# Patient Record
Sex: Female | Born: 1985 | Race: Black or African American | Hispanic: No | Marital: Single | Smoking: Current every day smoker
Health system: Southern US, Community
[De-identification: ages and names within clinical notes are randomized; demographics above are authoritative.]

## PROBLEM LIST (undated history)

## (undated) ENCOUNTER — Inpatient Hospital Stay (HOSPITAL_COMMUNITY): Payer: Self-pay

## (undated) DIAGNOSIS — K819 Cholecystitis, unspecified: Secondary | ICD-10-CM

## (undated) DIAGNOSIS — A599 Trichomoniasis, unspecified: Secondary | ICD-10-CM

## (undated) DIAGNOSIS — A749 Chlamydial infection, unspecified: Secondary | ICD-10-CM

## (undated) HISTORY — PX: NO PAST SURGERIES: SHX2092

---

## 2006-01-01 ENCOUNTER — Inpatient Hospital Stay (HOSPITAL_COMMUNITY): Admission: AD | Admit: 2006-01-01 | Discharge: 2006-01-01 | Payer: Self-pay | Admitting: Family Medicine

## 2007-04-22 ENCOUNTER — Inpatient Hospital Stay (HOSPITAL_COMMUNITY): Admission: AD | Admit: 2007-04-22 | Discharge: 2007-04-22 | Payer: Self-pay | Admitting: Family Medicine

## 2007-04-24 ENCOUNTER — Inpatient Hospital Stay (HOSPITAL_COMMUNITY): Admission: AD | Admit: 2007-04-24 | Discharge: 2007-04-24 | Payer: Self-pay | Admitting: Family Medicine

## 2007-04-26 ENCOUNTER — Inpatient Hospital Stay (HOSPITAL_COMMUNITY): Admission: AD | Admit: 2007-04-26 | Discharge: 2007-04-26 | Payer: Self-pay | Admitting: Obstetrics & Gynecology

## 2007-05-03 ENCOUNTER — Ambulatory Visit (HOSPITAL_COMMUNITY): Admission: RE | Admit: 2007-05-03 | Discharge: 2007-05-03 | Payer: Self-pay | Admitting: Obstetrics & Gynecology

## 2007-06-13 ENCOUNTER — Inpatient Hospital Stay (HOSPITAL_COMMUNITY): Admission: AD | Admit: 2007-06-13 | Discharge: 2007-06-13 | Payer: Self-pay | Admitting: Obstetrics & Gynecology

## 2007-10-17 ENCOUNTER — Ambulatory Visit (HOSPITAL_COMMUNITY): Admission: RE | Admit: 2007-10-17 | Discharge: 2007-10-17 | Payer: Self-pay | Admitting: Obstetrics & Gynecology

## 2007-12-17 ENCOUNTER — Inpatient Hospital Stay (HOSPITAL_COMMUNITY): Admission: AD | Admit: 2007-12-17 | Discharge: 2007-12-17 | Payer: Self-pay | Admitting: Obstetrics & Gynecology

## 2007-12-20 ENCOUNTER — Inpatient Hospital Stay (HOSPITAL_COMMUNITY): Admission: AD | Admit: 2007-12-20 | Discharge: 2007-12-22 | Payer: Self-pay | Admitting: Obstetrics

## 2009-03-04 IMAGING — US US OB TRANSVAGINAL
1 series · 14 of 28 positions shown · non-contrast
Comparison: none

OBSTETRICAL ULTRASOUND:

 This ultrasound exam was performed in the [HOSPITAL] Ultrasound Department.  The OB US report was generated in the AS system, and faxed to the ordering physician.  This report is also available in [REDACTED] PACS.

[Series 1: us ob transvaginal · 32 acquisitions, 14 frames shown]
[im 2/32]
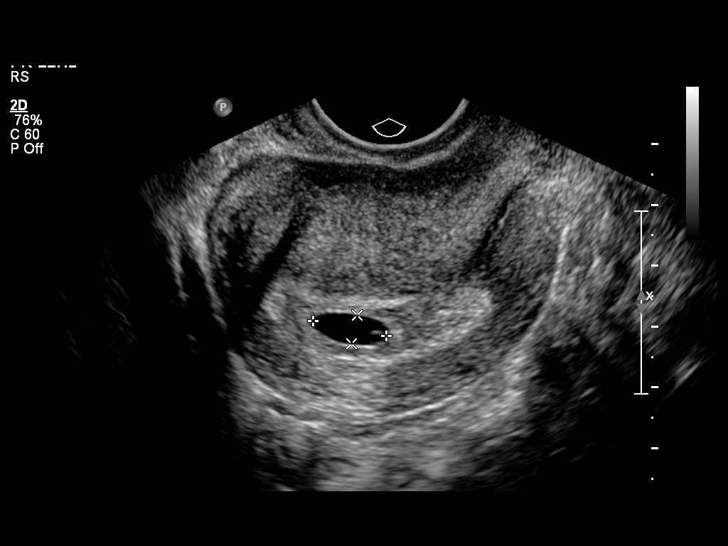
[im 4/32]
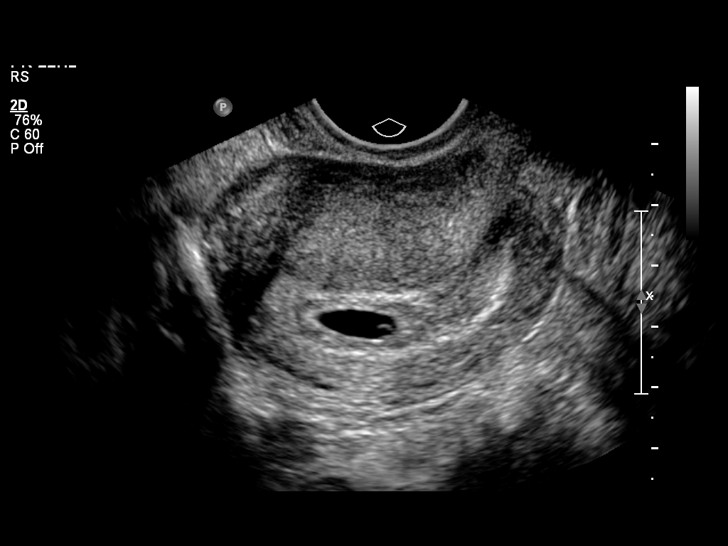
[im 6/32]
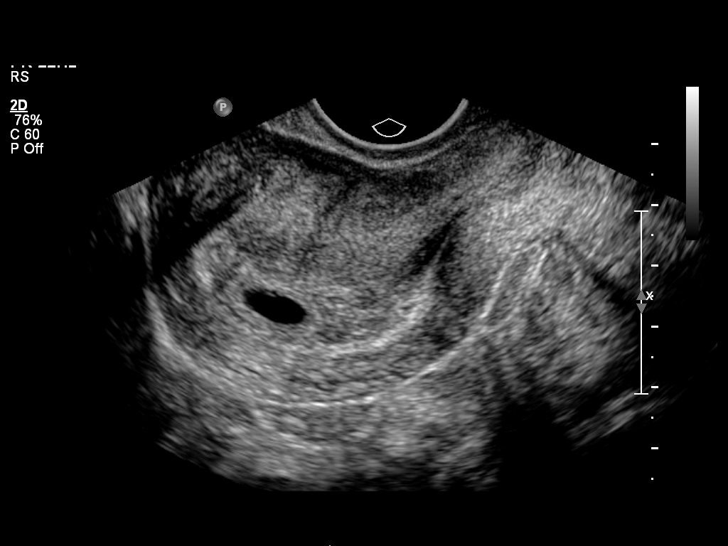
[im 9/32]
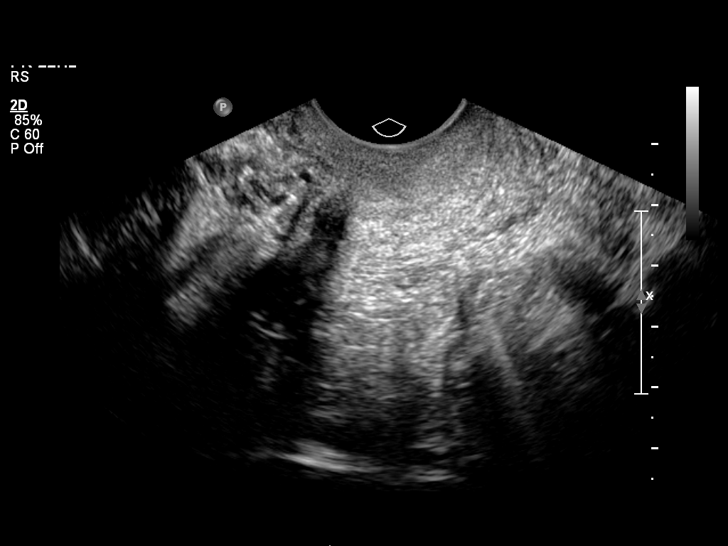
[im 11/32]
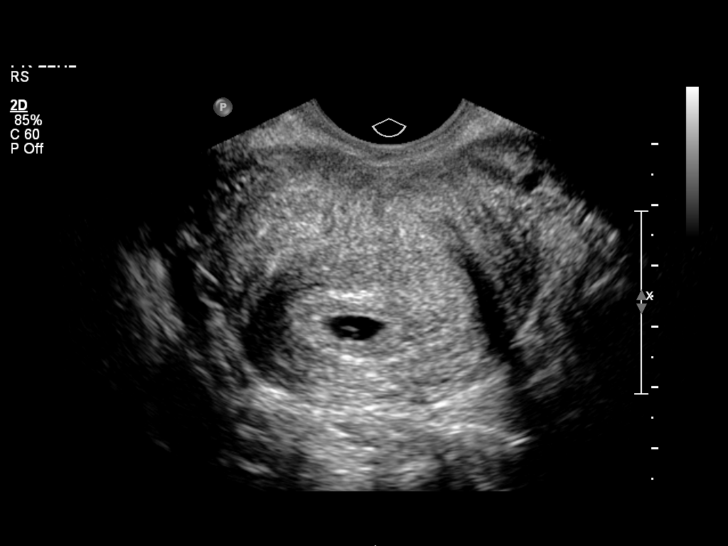
[im 13/32]
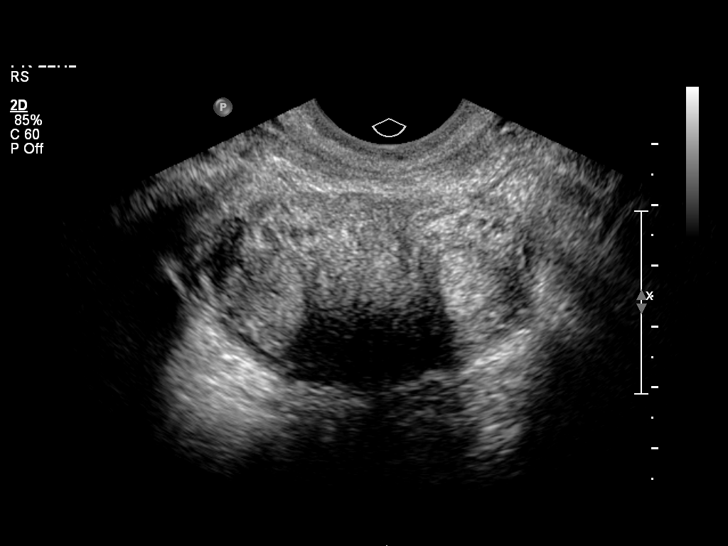
[im 15/32]
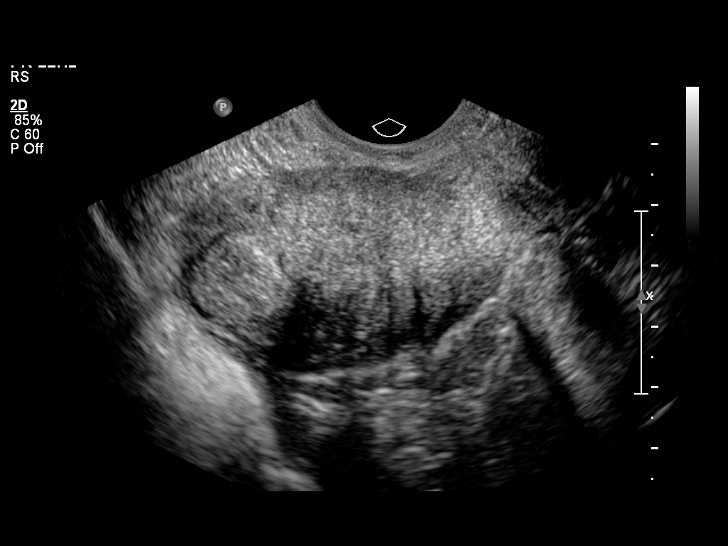
[im 18/32]
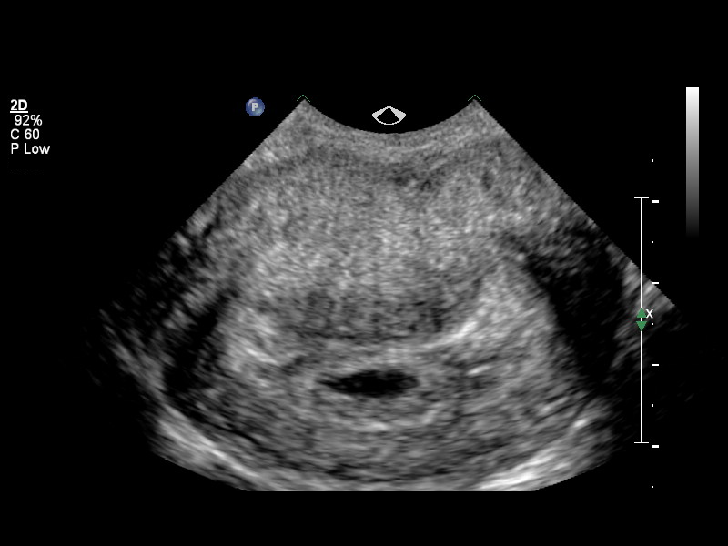
[im 20/32]
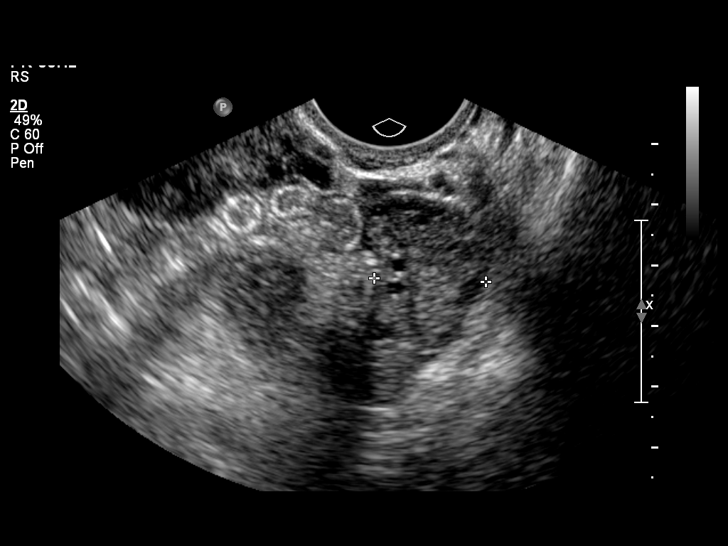
[im 22/32]
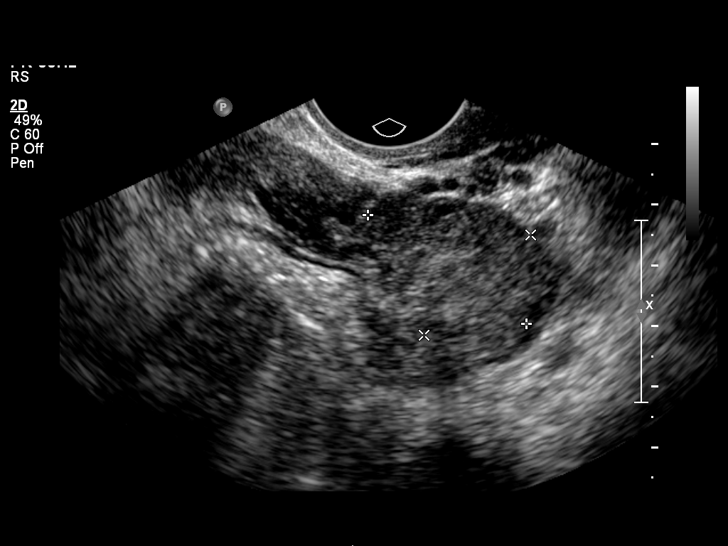
[im 25/32]
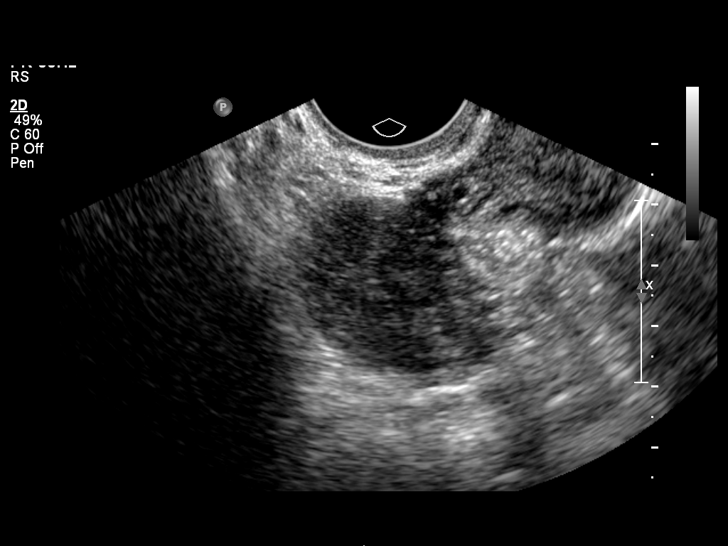
[im 27/32]
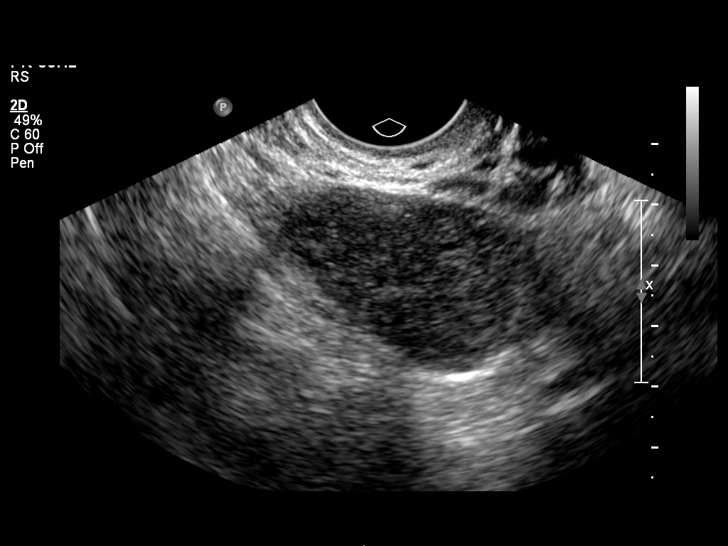
[im 29/32]
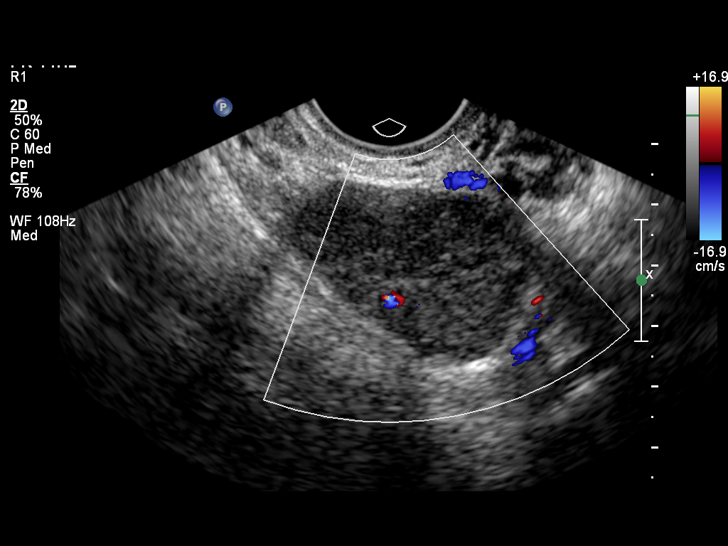
[im 32/32]
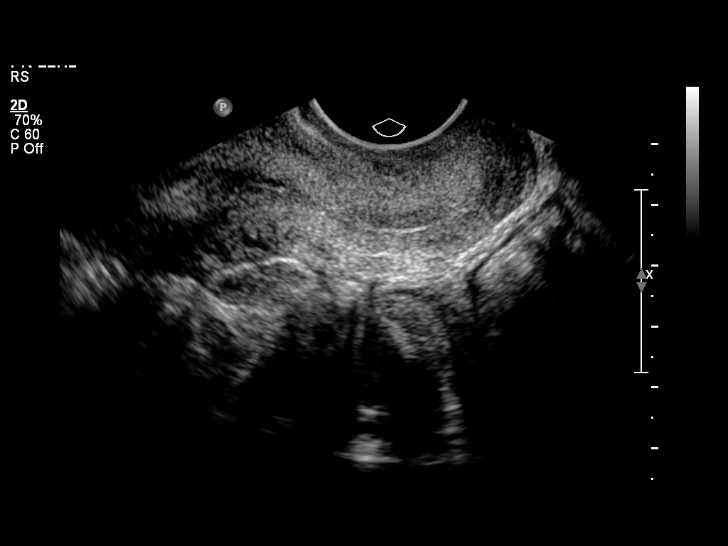

[14 of 28 positions shown; findings below may reference images not displayed]

IMPRESSION: See AS Obstetric US report.

## 2009-11-08 ENCOUNTER — Inpatient Hospital Stay (HOSPITAL_COMMUNITY): Admission: AD | Admit: 2009-11-08 | Discharge: 2009-11-08 | Payer: Self-pay | Admitting: Obstetrics & Gynecology

## 2010-03-29 ENCOUNTER — Inpatient Hospital Stay (HOSPITAL_COMMUNITY): Admission: AD | Admit: 2010-03-29 | Discharge: 2010-03-29 | Payer: Self-pay | Admitting: Obstetrics & Gynecology

## 2010-11-25 ENCOUNTER — Inpatient Hospital Stay (HOSPITAL_COMMUNITY)
Admission: AD | Admit: 2010-11-25 | Discharge: 2010-11-25 | Payer: Self-pay | Source: Home / Self Care | Attending: Obstetrics & Gynecology | Admitting: Obstetrics & Gynecology

## 2011-02-24 LAB — URINALYSIS, ROUTINE W REFLEX MICROSCOPIC
Glucose, UA: NEGATIVE mg/dL
Nitrite: POSITIVE — AB
Protein, ur: NEGATIVE mg/dL
Specific Gravity, Urine: 1.025 (ref 1.005–1.030)
pH: 6 (ref 5.0–8.0)

## 2011-02-24 LAB — WET PREP, GENITAL
Clue Cells Wet Prep HPF POC: NONE SEEN
Yeast Wet Prep HPF POC: NONE SEEN

## 2011-02-24 LAB — URINE MICROSCOPIC-ADD ON

## 2011-02-24 LAB — POCT PREGNANCY, URINE: Preg Test, Ur: NEGATIVE

## 2011-03-03 LAB — URINE MICROSCOPIC-ADD ON

## 2011-03-03 LAB — WET PREP, GENITAL: Trich, Wet Prep: NONE SEEN

## 2011-03-03 LAB — URINALYSIS, ROUTINE W REFLEX MICROSCOPIC
Nitrite: POSITIVE — AB
Specific Gravity, Urine: 1.025 (ref 1.005–1.030)

## 2011-03-03 LAB — CBC
MCV: 83.3 fL (ref 78.0–100.0)
Platelets: 230 10*3/uL (ref 150–400)
RBC: 4.38 MIL/uL (ref 3.87–5.11)
RDW: 14.3 % (ref 11.5–15.5)
WBC: 7.8 10*3/uL (ref 4.0–10.5)

## 2011-03-18 LAB — URINALYSIS, ROUTINE W REFLEX MICROSCOPIC
Glucose, UA: NEGATIVE mg/dL
Leukocytes, UA: NEGATIVE
Protein, ur: NEGATIVE mg/dL
Specific Gravity, Urine: 1.025 (ref 1.005–1.030)
Urobilinogen, UA: 0.2 mg/dL (ref 0.0–1.0)
pH: 6 (ref 5.0–8.0)

## 2011-03-18 LAB — GC/CHLAMYDIA PROBE AMP, GENITAL
Chlamydia, DNA Probe: NEGATIVE
GC Probe Amp, Genital: NEGATIVE

## 2011-03-18 LAB — WET PREP, GENITAL
Trich, Wet Prep: NONE SEEN
Yeast Wet Prep HPF POC: NONE SEEN

## 2011-03-18 LAB — URINE MICROSCOPIC-ADD ON

## 2011-08-27 ENCOUNTER — Encounter (HOSPITAL_COMMUNITY): Payer: Self-pay | Admitting: *Deleted

## 2011-08-27 ENCOUNTER — Inpatient Hospital Stay (HOSPITAL_COMMUNITY)
Admission: AD | Admit: 2011-08-27 | Discharge: 2011-08-27 | Disposition: A | Discharge status: Home / Self Care | Payer: Self-pay | Source: Ambulatory Visit | Attending: Family Medicine | Admitting: Family Medicine

## 2011-08-27 DIAGNOSIS — R109 Unspecified abdominal pain: Secondary | ICD-10-CM | POA: Insufficient documentation

## 2011-08-27 DIAGNOSIS — A499 Bacterial infection, unspecified: Secondary | ICD-10-CM

## 2011-08-27 DIAGNOSIS — B9689 Other specified bacterial agents as the cause of diseases classified elsewhere: Secondary | ICD-10-CM | POA: Insufficient documentation

## 2011-08-27 DIAGNOSIS — N760 Acute vaginitis: Secondary | ICD-10-CM | POA: Insufficient documentation

## 2011-08-27 DIAGNOSIS — N390 Urinary tract infection, site not specified: Secondary | ICD-10-CM | POA: Insufficient documentation

## 2011-08-27 LAB — URINALYSIS, ROUTINE W REFLEX MICROSCOPIC
Hgb urine dipstick: NEGATIVE
Nitrite: POSITIVE — AB
Protein, ur: NEGATIVE mg/dL
Urobilinogen, UA: 1 mg/dL (ref 0.0–1.0)

## 2011-08-27 LAB — WET PREP, GENITAL: Yeast Wet Prep HPF POC: NONE SEEN

## 2011-08-27 LAB — URINE MICROSCOPIC-ADD ON

## 2011-08-27 MED ORDER — CIPROFLOXACIN HCL 500 MG PO TABS: 500.0000 mg | ORAL_TABLET | Freq: Two times a day (BID) | ORAL | Status: AC

## 2011-08-27 MED ORDER — AZITHROMYCIN 250 MG PO TABS
1000.0000 mg | ORAL_TABLET | Freq: Once | ORAL | Status: AC
  Administered 2011-08-27: 1000 mg via ORAL
  Filled 2011-08-27: qty 4

## 2011-08-27 MED ORDER — AZITHROMYCIN 1 G PO PACK: 1.0000 g | PACK | Freq: Once | ORAL | Status: DC

## 2011-08-27 MED ORDER — CEFTRIAXONE SODIUM 250 MG IJ SOLR
250.0000 mg | Freq: Once | INTRAMUSCULAR | Status: AC
  Administered 2011-08-27: 250 mg via INTRAMUSCULAR
  Filled 2011-08-27: qty 250

## 2011-08-27 MED ORDER — METRONIDAZOLE 500 MG PO TABS: 500.0000 mg | ORAL_TABLET | Freq: Two times a day (BID) | ORAL | Status: AC

## 2011-08-27 NOTE — ED Provider Notes (Signed)
History     Chief Complaint  Patient presents with  . Abdominal Pain   HPI Right side and low abd pain x 1 hour. + white discharge x 2 weeks. + dyspareunia since last week. H/O chlamydia. Unprotected intercourse. No birth control. Requesting treatment for chlamydia. States she feels the same as when she had it before.   OB History    Grav Para Term Preterm Abortions TAB SAB Ect Mult Living   2 1 0 0 1 0 0 0 0 1       Past Medical History  Diagnosis Date  . No pertinent past medical history     Past Surgical History  Procedure Date  . No past surgeries     No family history on file.  History  Substance Use Topics  . Smoking status: Current Everyday Smoker -- 0.5 packs/day  . Smokeless tobacco: Never Used  . Alcohol Use: 2.4 oz/week    4 Shots of liquor per week    Allergies: No Known Allergies  No prescriptions prior to admission    Review of Systems  Constitutional: Negative.   Respiratory: Negative.   Cardiovascular: Negative.   Gastrointestinal: Positive for abdominal pain. Negative for nausea, vomiting, diarrhea and constipation.  Genitourinary: Negative for dysuria, urgency, frequency, hematuria and flank pain.       Negative for vaginal bleeding, Positive for vaginal discharge, dyspareunia  Musculoskeletal: Negative.   Neurological: Negative.   Psychiatric/Behavioral: Negative.    Physical Exam   Blood pressure 118/76, pulse 73, temperature 99.1 F (37.3 C), temperature source Oral, resp. rate 16, height 5\' 4"  (1.626 m), weight 57.244 kg (126 lb 3.2 oz), last menstrual period 07/15/2011, SpO2 98.00%.  Physical Exam  Constitutional: She is oriented to person, place, and time. She appears well-developed and well-nourished. No distress.  HENT:  Head: Normocephalic and atraumatic.  Cardiovascular: Normal rate, regular rhythm and normal heart sounds.   Respiratory: Effort normal and breath sounds normal. No respiratory distress.  GI: Soft. Bowel sounds  are normal. She exhibits no distension and no mass. There is no tenderness. There is no rebound and no guarding.  Genitourinary: There is no rash or lesion on the right labia. There is no rash or lesion on the left labia. Uterus is not deviated, not enlarged, not fixed and not tender. Cervix exhibits no motion tenderness, no discharge and no friability. Right adnexum displays no mass, no tenderness and no fullness. Left adnexum displays no mass, no tenderness and no fullness. No erythema, tenderness or bleeding around the vagina. Vaginal discharge (foamy, malodorous white ) found.  Neurological: She is alert and oriented to person, place, and time.  Skin: Skin is warm and dry.  Psychiatric: She has a normal mood and affect.    MAU Course  Procedures Results for orders placed during the hospital encounter of 08/27/11 (from the past 24 hour(s))  URINALYSIS, ROUTINE W REFLEX MICROSCOPIC     Status: Abnormal   Collection Time   08/27/11  7:08 PM      Component Value Range   Color, Urine YELLOW  YELLOW    Appearance CLOUDY (*) CLEAR    Specific Gravity, Urine 1.025  1.005 - 1.030    pH 7.0  5.0 - 8.0    Glucose, UA NEGATIVE  NEGATIVE (mg/dL)   Hgb urine dipstick NEGATIVE  NEGATIVE    Bilirubin Urine NEGATIVE  NEGATIVE    Ketones, ur NEGATIVE  NEGATIVE (mg/dL)   Protein, ur NEGATIVE  NEGATIVE (mg/dL)  Urobilinogen, UA 1.0  0.0 - 1.0 (mg/dL)   Nitrite POSITIVE (*) NEGATIVE    Leukocytes, UA TRACE (*) NEGATIVE   URINE MICROSCOPIC-ADD ON     Status: Abnormal   Collection Time   08/27/11  7:08 PM      Component Value Range   Squamous Epithelial / LPF MANY (*) RARE    WBC, UA 3-6  <3 (WBC/hpf)   Bacteria, UA MANY (*) RARE    Crystals CA OXALATE CRYSTALS (*) NEGATIVE    Urine-Other MUCOUS PRESENT    POCT PREGNANCY, URINE     Status: Normal   Collection Time   08/27/11  7:14 PM      Component Value Range   Preg Test, Ur NEGATIVE       Assessment and Plan  UTI - rx Cipro BV - rx Flagyl    Presumptive treatment for GC/CT with Azithromycin and Rocephin per patient request [facility] Counseled on safer sex practices  FRAZIER,NATALIE 08/27/2011, 8:47 PM

## 2011-08-27 NOTE — Progress Notes (Signed)
Pt states she is having cramping pain on her right side when she breathes in. Pt describes pain as a cramp. Pt states she had this pain before but never went to have it evaluated

## 2011-08-27 NOTE — ED Provider Notes (Signed)
Chart reviewed and agree with management and plan.  

## 2011-08-27 NOTE — Progress Notes (Signed)
Pt states she has had lower abdominal pain for about 1 1/2 weeks off and on. Has a moderate white discharge without odor. Vaginal burning. No bleeding. Pt states she has a history of irregular periods.

## 2011-09-03 LAB — CBC
HCT: 37.6
Hemoglobin: 12.1
MCHC: 32.2
MCHC: 32.3
MCV: 71.3 — ABNORMAL LOW
MCV: 71.8 — ABNORMAL LOW
Platelets: 226
Platelets: 276
RDW: 16.3 — ABNORMAL HIGH
RDW: 16.7 — ABNORMAL HIGH

## 2011-09-30 LAB — URINE MICROSCOPIC-ADD ON

## 2011-09-30 LAB — WET PREP, GENITAL
Clue Cells Wet Prep HPF POC: NONE SEEN
Trich, Wet Prep: NONE SEEN
Yeast Wet Prep HPF POC: NONE SEEN

## 2011-09-30 LAB — URINALYSIS, ROUTINE W REFLEX MICROSCOPIC
Glucose, UA: 250 — AB
Ketones, ur: 15 — AB
Leukocytes, UA: NEGATIVE
Nitrite: NEGATIVE
Protein, ur: NEGATIVE
Urobilinogen, UA: 0.2

## 2011-12-22 ENCOUNTER — Inpatient Hospital Stay (HOSPITAL_COMMUNITY)
Admission: AD | Admit: 2011-12-22 | Discharge: 2011-12-22 | Disposition: A | Discharge status: Home / Self Care | Payer: Medicaid Other | Source: Ambulatory Visit | Attending: Obstetrics & Gynecology | Admitting: Obstetrics & Gynecology

## 2011-12-22 ENCOUNTER — Encounter (HOSPITAL_COMMUNITY): Payer: Self-pay | Admitting: *Deleted

## 2011-12-22 DIAGNOSIS — B9689 Other specified bacterial agents as the cause of diseases classified elsewhere: Secondary | ICD-10-CM | POA: Insufficient documentation

## 2011-12-22 DIAGNOSIS — A499 Bacterial infection, unspecified: Secondary | ICD-10-CM

## 2011-12-22 DIAGNOSIS — N760 Acute vaginitis: Secondary | ICD-10-CM | POA: Insufficient documentation

## 2011-12-22 DIAGNOSIS — R1012 Left upper quadrant pain: Secondary | ICD-10-CM | POA: Insufficient documentation

## 2011-12-22 DIAGNOSIS — N949 Unspecified condition associated with female genital organs and menstrual cycle: Secondary | ICD-10-CM | POA: Insufficient documentation

## 2011-12-22 LAB — URINALYSIS, ROUTINE W REFLEX MICROSCOPIC
Glucose, UA: NEGATIVE mg/dL
Hgb urine dipstick: NEGATIVE
Ketones, ur: NEGATIVE mg/dL
Leukocytes, UA: NEGATIVE
Protein, ur: NEGATIVE mg/dL
Urobilinogen, UA: 0.2 mg/dL (ref 0.0–1.0)

## 2011-12-22 LAB — WET PREP, GENITAL

## 2011-12-22 MED ORDER — METRONIDAZOLE 500 MG PO TABS: 500.0000 mg | ORAL_TABLET | Freq: Two times a day (BID) | ORAL | Status: AC

## 2011-12-22 NOTE — Progress Notes (Addendum)
Pt in c/o aching in ulq x1 week.  Reports inability to hold bladder.  Reports a normal, clear discharge.  Believes she has BV due to not finishing last prescription.

## 2011-12-22 NOTE — ED Provider Notes (Signed)
History   Pt present today c/o vag dc with odor. She has also noted some LUQ pain that comes and goes for the past several weeks. She denies fever, vag bleeding, or any other sx at this time.  Chief Complaint  Patient presents with  . Vaginal Discharge   HPI  OB History    Grav Para Term Preterm Abortions TAB SAB Ect Mult Living   2 1 0 0 1 0 0 0 0 1       Past Medical History  Diagnosis Date  . No pertinent past medical history     Past Surgical History  Procedure Date  . No past surgeries     No family history on file.  History  Substance Use Topics  . Smoking status: Current Everyday Smoker -- 0.5 packs/day  . Smokeless tobacco: Never Used  . Alcohol Use: 2.4 oz/week    4 Shots of liquor per week    Allergies: No Known Allergies  No prescriptions prior to admission    Review of Systems  Constitutional: Negative for fever.  Eyes: Negative for blurred vision.  Cardiovascular: Negative for chest pain and palpitations.  Gastrointestinal: Negative for nausea, vomiting, abdominal pain, diarrhea and constipation.  Genitourinary: Negative for dysuria, urgency, frequency and hematuria.  Neurological: Negative for dizziness and headaches.  Psychiatric/Behavioral: Negative for depression and suicidal ideas.   Physical Exam   Blood pressure 115/66, pulse 67, temperature 98.8 F (37.1 C), temperature source Oral, resp. rate 16, height 5' 4.5" (1.638 m), weight 131 lb 6.4 oz (59.603 kg), last menstrual period 10/29/2011, SpO2 99.00%.  Physical Exam  Nursing note and vitals reviewed. Constitutional: She is oriented to person, place, and time. She appears well-developed and well-nourished. No distress.  HENT:  Head: Normocephalic and atraumatic.  Eyes: EOM are normal. Pupils are equal, round, and reactive to light.  GI: Soft. She exhibits no distension. There is no tenderness. There is no rebound and no guarding.  Genitourinary: No bleeding around the vagina. Vaginal  discharge found.       Thin, greenish vag dc present. Uterus NL size and shape. No adnexal masses. Pt non-tender on exam.  Neurological: She is alert and oriented to person, place, and time.  Skin: Skin is warm and dry. She is not diaphoretic.  Psychiatric: She has a normal mood and affect. Her behavior is normal. Judgment and thought content normal.    MAU Course  Procedures  Wet prep and GC/Chlamydia cultures done.  Results for orders placed during the hospital encounter of 12/22/11 (from the past 24 hour(s))  URINALYSIS, ROUTINE W REFLEX MICROSCOPIC     Status: Normal   Collection Time   12/22/11  3:30 PM      Component Value Range   Color, Urine YELLOW  YELLOW    APPearance CLEAR  CLEAR    Specific Gravity, Urine 1.010  1.005 - 1.030    pH 6.0  5.0 - 8.0    Glucose, UA NEGATIVE  NEGATIVE (mg/dL)   Hgb urine dipstick NEGATIVE  NEGATIVE    Bilirubin Urine NEGATIVE  NEGATIVE    Ketones, ur NEGATIVE  NEGATIVE (mg/dL)   Protein, ur NEGATIVE  NEGATIVE (mg/dL)   Urobilinogen, UA 0.2  0.0 - 1.0 (mg/dL)   Nitrite NEGATIVE  NEGATIVE    Leukocytes, UA NEGATIVE  NEGATIVE   POCT PREGNANCY, URINE     Status: Normal   Collection Time   12/22/11  3:36 PM      Component Value Range  Preg Test, Ur NEGATIVE    WET PREP, GENITAL     Status: Abnormal   Collection Time   12/22/11  5:35 PM      Component Value Range   Yeast, Wet Prep NONE SEEN  NONE SEEN    Trich, Wet Prep NONE SEEN  NONE SEEN    Clue Cells, Wet Prep MODERATE (*) NONE SEEN    WBC, Wet Prep HPF POC MODERATE (*) NONE SEEN      Assessment and Plan  BV: discussed with pt at length. Will tx with Flagyl. Warned of antabuse reaction. Discussed diet, activity, risks, and precautions.  Clinton Gallant. Rice III, DrHSc, MPAS, PA-C  12/22/2011, 5:42 PM   Henrietta Hoover, PA 12/22/11 1759

## 2011-12-22 NOTE — Progress Notes (Signed)
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 Did not complete her medication from the last diagnosis about one month ago. Had an appointment with [facility] and was unable to go. Has been having left abdominal pain off and on, none now. Has not had a period since mid November.

## 2011-12-23 LAB — GC/CHLAMYDIA PROBE AMP, GENITAL: Chlamydia, DNA Probe: NEGATIVE

## 2011-12-23 NOTE — ED Provider Notes (Signed)
Attestation of Attending Supervision of Advanced Practitioner: Evaluation and management procedures were performed by the PA/NP/CNM/OB Fellow under my supervision/collaboration. Chart reviewed, and agree with management and plan.  Jaynie Collins, M.D. 12/23/2011 9:23 AM

## 2013-05-19 ENCOUNTER — Inpatient Hospital Stay (HOSPITAL_COMMUNITY)
Admission: AD | Admit: 2013-05-19 | Discharge: 2013-05-19 | Disposition: A | Discharge status: Home / Self Care | Payer: Medicaid Other | Source: Ambulatory Visit | Attending: Family Medicine | Admitting: Family Medicine

## 2013-05-19 ENCOUNTER — Encounter (HOSPITAL_COMMUNITY): Payer: Self-pay | Admitting: *Deleted

## 2013-05-19 DIAGNOSIS — N949 Unspecified condition associated with female genital organs and menstrual cycle: Secondary | ICD-10-CM | POA: Insufficient documentation

## 2013-05-19 DIAGNOSIS — B9689 Other specified bacterial agents as the cause of diseases classified elsewhere: Secondary | ICD-10-CM

## 2013-05-19 DIAGNOSIS — A499 Bacterial infection, unspecified: Secondary | ICD-10-CM

## 2013-05-19 DIAGNOSIS — R350 Frequency of micturition: Secondary | ICD-10-CM | POA: Insufficient documentation

## 2013-05-19 DIAGNOSIS — N760 Acute vaginitis: Secondary | ICD-10-CM | POA: Insufficient documentation

## 2013-05-19 HISTORY — DX: Trichomoniasis, unspecified: A59.9

## 2013-05-19 HISTORY — DX: Chlamydial infection, unspecified: A74.9

## 2013-05-19 LAB — URINALYSIS, ROUTINE W REFLEX MICROSCOPIC
Ketones, ur: NEGATIVE mg/dL
Leukocytes, UA: NEGATIVE
Nitrite: NEGATIVE
Protein, ur: NEGATIVE mg/dL

## 2013-05-19 MED ORDER — METRONIDAZOLE 500 MG PO TABS: 500.0000 mg | ORAL_TABLET | Freq: Two times a day (BID) | ORAL | Status: DC

## 2013-05-19 NOTE — MAU Provider Note (Signed)
History     CSN: [id number]  Arrival date and time: 05/19/13 8841   First Provider Initiated Contact with Patient 05/19/13 1050      Chief Complaint  Patient presents with  . Vaginal Discharge  . Urinary Frequency   HPI Ms. Brianna Jimenez is a 27 y.o. [id number] who presents to MAU today with vaginal discharge x 1 week. Described as white, thin and with odor. The patient denies bleeding. LMP was 05/10/13. The patient is also concerned about increased frequency of urination with urgency. The patient denies dysuria, pelvic pain, fever or back pain. She is sexually active and states that she uses protection and is on OCPs.   OB History   Grav Para Term Preterm Abortions TAB SAB Ect Mult Living   2 1 0 0 1 0 0 0 0 1       Past Medical History  Diagnosis Date  . No pertinent past medical history   . Chlamydia   . Trichomonas     Past Surgical History  Procedure Laterality Date  . Dilation and curettage of uterus      History reviewed. No pertinent family history.  History  Substance Use Topics  . Smoking status: Current Every Day Smoker -- 0.50 packs/day    Types: Cigarettes  . Smokeless tobacco: Never Used  . Alcohol Use: 2.4 oz/week    4 Shots of liquor per week     Comment: occasional    Allergies: No Known Allergies  No prescriptions prior to admission    Review of Systems  Constitutional: Negative for fever and malaise/fatigue.  Gastrointestinal: Negative for nausea, vomiting, abdominal pain, diarrhea and constipation.  Genitourinary: Positive for urgency and frequency. Negative for dysuria.       + vaginal discharge Neg - vaginal bleeding   Physical Exam   Blood pressure 109/52, pulse 59, temperature 98.3 F (36.8 C), temperature source Oral, resp. rate 18, height 5\' 4"  (1.626 m), weight 133 lb (60.328 kg), last menstrual period 05/10/2013, SpO2 100.00%.  Physical Exam  Constitutional: She is oriented to person, place, and time. She appears  well-developed and well-nourished. No distress.  HENT:  Head: Normocephalic and atraumatic.  Cardiovascular: Normal rate, regular rhythm and normal heart sounds.   Respiratory: Effort normal and breath sounds normal. No respiratory distress.  GI: Soft. Bowel sounds are normal. She exhibits no distension and no mass. There is tenderness (very mild tenderness to palpation of the pelvic region at midline). There is no rebound and no guarding.  Genitourinary: Uterus is not enlarged and not tender. Cervix exhibits no motion tenderness, no discharge and no friability. Right adnexum displays no mass and no tenderness. Left adnexum displays no mass and no tenderness. No bleeding around the vagina. Vaginal discharge (small amount of thin, white discharge noted) found.  Neurological: She is alert and oriented to person, place, and time.  Skin: Skin is warm and dry. No erythema.  Psychiatric: She has a normal mood and affect.   Results for orders placed during the hospital encounter of 05/19/13 (from the past 24 hour(s))  URINALYSIS, ROUTINE W REFLEX MICROSCOPIC     Status: None   Collection Time    05/19/13 10:10 AM      Result Value Range   Color, Urine YELLOW  YELLOW   APPearance CLEAR  CLEAR   Specific Gravity, Urine 1.015  1.005 - 1.030   pH 6.0  5.0 - 8.0   Glucose, UA NEGATIVE  NEGATIVE mg/dL   Hgb  urine dipstick NEGATIVE  NEGATIVE   Bilirubin Urine NEGATIVE  NEGATIVE   Ketones, ur NEGATIVE  NEGATIVE mg/dL   Protein, ur NEGATIVE  NEGATIVE mg/dL   Urobilinogen, UA 0.2  0.0 - 1.0 mg/dL   Nitrite NEGATIVE  NEGATIVE   Leukocytes, UA NEGATIVE  NEGATIVE  POCT PREGNANCY, URINE     Status: None   Collection Time    05/19/13 10:23 AM      Result Value Range   Preg Test, Ur NEGATIVE  NEGATIVE  WET PREP, GENITAL     Status: Abnormal   Collection Time    05/19/13 11:00 AM      Result Value Range   Yeast Wet Prep HPF POC NONE SEEN  NONE SEEN   Trich, Wet Prep NONE SEEN  NONE SEEN   Clue Cells  Wet Prep HPF POC FEW (*) NONE SEEN   WBC, Wet Prep HPF POC MANY (*) NONE SEEN    MAU Course  Procedures None  MDM UA, UPT, Wet prep, GC/Chlamydia today  Assessment and Plan  A: Bacterial vaginosis  P: Discharge home Rx for Flagyl sent to patient's pharmacy Discussed hygiene products for avoiding recurrent BV Patient encouraged to follow-up with PCP if symptoms persist or worsen Patient may return to MAU as needed  Freddi Starr, PA-C  05/19/2013, 6:15 PM

## 2013-05-19 NOTE — MAU Provider Note (Signed)
Chart reviewed and agree with management and plan.  

## 2013-05-19 NOTE — MAU Note (Signed)
Patient states that she has had a vaginal discharge with an odor for about one week. Has also had urinary frequency. No pain. No bleeding.

## 2013-05-20 LAB — GC/CHLAMYDIA PROBE AMP: GC Probe RNA: NEGATIVE

## 2014-10-15 ENCOUNTER — Encounter (HOSPITAL_COMMUNITY): Payer: Self-pay | Admitting: *Deleted

## 2015-02-08 ENCOUNTER — Emergency Department (INDEPENDENT_AMBULATORY_CARE_PROVIDER_SITE_OTHER)
Admission: EM | Admit: 2015-02-08 | Discharge: 2015-02-08 | Disposition: A | Discharge status: Home / Self Care | Payer: 59 | Source: Home / Self Care | Attending: Family Medicine | Admitting: Family Medicine

## 2015-02-08 ENCOUNTER — Encounter (HOSPITAL_COMMUNITY): Payer: Self-pay

## 2015-02-08 ENCOUNTER — Other Ambulatory Visit (HOSPITAL_COMMUNITY)
Admission: RE | Admit: 2015-02-08 | Discharge: 2015-02-08 | Disposition: A | Discharge status: Home / Self Care | Payer: 59 | Source: Ambulatory Visit | Attending: Family Medicine | Admitting: Family Medicine

## 2015-02-08 DIAGNOSIS — N760 Acute vaginitis: Secondary | ICD-10-CM

## 2015-02-08 DIAGNOSIS — N390 Urinary tract infection, site not specified: Secondary | ICD-10-CM | POA: Diagnosis not present

## 2015-02-08 DIAGNOSIS — Z113 Encounter for screening for infections with a predominantly sexual mode of transmission: Secondary | ICD-10-CM | POA: Diagnosis present

## 2015-02-08 DIAGNOSIS — A499 Bacterial infection, unspecified: Secondary | ICD-10-CM | POA: Diagnosis not present

## 2015-02-08 DIAGNOSIS — B9689 Other specified bacterial agents as the cause of diseases classified elsewhere: Secondary | ICD-10-CM

## 2015-02-08 LAB — POCT URINALYSIS DIP (DEVICE)
Bilirubin Urine: NEGATIVE
Glucose, UA: NEGATIVE mg/dL
Ketones, ur: NEGATIVE mg/dL
Nitrite: NEGATIVE
Protein, ur: NEGATIVE mg/dL
SPECIFIC GRAVITY, URINE: 1.02 (ref 1.005–1.030)
UROBILINOGEN UA: 0.2 mg/dL (ref 0.0–1.0)
pH: 6 (ref 5.0–8.0)

## 2015-02-08 LAB — POCT PREGNANCY, URINE: Preg Test, Ur: NEGATIVE

## 2015-02-08 MED ORDER — CEPHALEXIN 500 MG PO CAPS: 500.0000 mg | ORAL_CAPSULE | Freq: Four times a day (QID) | ORAL | Status: DC

## 2015-02-08 MED ORDER — METRONIDAZOLE 500 MG PO TABS: 500.0000 mg | ORAL_TABLET | Freq: Two times a day (BID) | ORAL | Status: DC

## 2015-02-08 NOTE — ED Provider Notes (Signed)
CSN: [id number]     Arrival date & time 02/08/15  1153 History   First MD Initiated Contact with Patient 02/08/15 1238     Chief Complaint  Patient presents with  . Vaginal Discharge   (Consider location/radiation/quality/duration/timing/severity/associated sxs/prior Treatment) HPI Comments: 29 year old female complaining of pain in the bilateral low back that is intermittent. Denies flank pain. She complains of dysuria for the end of her strain. For approximately 7-10 days. She is also complaining of a thin white vaginal discharge for several weeks. Denies fever although she has pelvic pain described as a "thumping discomfort". She has a history of chlamydia, Trichomonas, BV. She is a smoker.   Past Medical History  Diagnosis Date  . No pertinent past medical history   . Chlamydia   . Trichomonas    Past Surgical History  Procedure Laterality Date  . Dilation and curettage of uterus     History reviewed. No pertinent family history. History  Substance Use Topics  . Smoking status: Current Every Day Smoker -- 0.50 packs/day    Types: Cigarettes  . Smokeless tobacco: Never Used  . Alcohol Use: 2.4 oz/week    4 Shots of liquor per week     Comment: occasional   OB History    Gravida Para Term Preterm AB TAB SAB Ectopic Multiple Living   2 1 0 0 1 0 0 0 0 1      Review of Systems  Constitutional: Negative.   Respiratory: Negative.   Cardiovascular: Negative.   Gastrointestinal: Negative.   Genitourinary: Positive for dysuria, frequency, vaginal discharge and pelvic pain. Negative for flank pain and menstrual problem.  Musculoskeletal: Negative.   Neurological: Negative.     Allergies  Review of patient's allergies indicates no known allergies.  Home Medications   Prior to Admission medications   Medication Sig Start Date End Date Taking? Authorizing Provider  cephALEXin (KEFLEX) 500 MG capsule Take 1 capsule (500 mg total) by mouth 4 (four) times daily. 02/08/15    Janne Napoleon, NP  metroNIDAZOLE (FLAGYL) 500 MG tablet Take 1 tablet (500 mg total) by mouth 2 (two) times daily. X 7 days 02/08/15   Janne Napoleon, NP   BP 112/67 mmHg  Pulse 67  Temp(Src) 98.6 F (37 C) (Oral)  Resp 18  SpO2 98%  LMP 01/14/2015 (Exact Date) Physical Exam  Constitutional: She is oriented to person, place, and time. She appears well-developed and well-nourished. No distress.  Eyes: Conjunctivae and EOM are normal.  Neck: Normal range of motion.  Cardiovascular: Normal rate and regular rhythm.   Pulmonary/Chest: Effort normal. No respiratory distress.  Abdominal: Soft. Bowel sounds are normal. She exhibits no distension and no mass. There is no tenderness. There is no rebound and no guarding.  Genitourinary:  Normal external female genitalia Intravaginal exam reveals thin gray to white discharge and small amount. There is thick mucosal fluid surrounding the cervix consistent with spinn. Cervix is pink and without lesions. The os is parous No CMT or adnexal tenderness.  Musculoskeletal: She exhibits no edema.  Neurological: She is alert and oriented to person, place, and time. No cranial nerve deficit. She exhibits normal muscle tone.  Skin: Skin is warm and dry.  Psychiatric: She has a normal mood and affect.  Nursing note and vitals reviewed.   ED Course  Procedures (including critical care time) Labs Review Labs Reviewed  POCT URINALYSIS DIP (DEVICE) - Abnormal; Notable for the following:    Hgb urine dipstick TRACE (*)  Leukocytes, UA MODERATE (*)    All other components within normal limits  URINE CULTURE  POCT PREGNANCY, URINE    Imaging Review No results found.   MDM   1. UTI (lower urinary tract infection)   2. BV (bacterial vaginosis)   3. Vaginitis    Cervical cytology pending Flagyl as directed Keflex as directed Drink plenty of fluids stay well-hydrated   Janne Napoleon, NP 02/08/15 1306

## 2015-02-08 NOTE — ED Notes (Signed)
C/o 2 week duration of vaginal d/c . Sexually active w/o [facility]

## 2015-02-08 NOTE — Discharge Instructions (Signed)
Bacterial Vaginosis °Bacterial vaginosis is a vaginal infection that occurs when the normal balance of bacteria in the vagina is disrupted. It results from an overgrowth of certain bacteria. This is the most common vaginal infection in women of childbearing age. Treatment is important to prevent complications, especially in pregnant women, as it can cause a premature delivery. °CAUSES  °Bacterial vaginosis is caused by an increase in harmful bacteria that are normally present in smaller amounts in the vagina. Several different kinds of bacteria can cause bacterial vaginosis. However, the reason that the condition develops is not fully understood. °RISK FACTORS °Certain activities or behaviors can put you at an increased risk of developing bacterial vaginosis, including: °· Having a new sex partner or multiple sex partners. °· Douching. °· Using an intrauterine device (IUD) for contraception. °Women do not get bacterial vaginosis from toilet seats, bedding, swimming pools, or contact with objects around them. °SIGNS AND SYMPTOMS  °Some women with bacterial vaginosis have no signs or symptoms. Common symptoms include: °· Grey vaginal discharge. °· A fishlike odor with discharge, especially after sexual intercourse. °· Itching or burning of the vagina and vulva. °· Burning or pain with urination. °DIAGNOSIS  °Your health care provider will take a medical history and examine the vagina for signs of bacterial vaginosis. A sample of vaginal fluid may be taken. Your health care provider will look at this sample under a microscope to check for bacteria and abnormal cells. A vaginal pH test may also be done.  °TREATMENT  °Bacterial vaginosis may be treated with antibiotic medicines. These may be given in the form of a pill or a vaginal cream. A second round of antibiotics may be prescribed if the condition comes back after treatment.  °HOME CARE INSTRUCTIONS  °· Only take over-the-counter or prescription medicines as  directed by your health care provider. °· If antibiotic medicine was prescribed, take it as directed. Make sure you finish it even if you start to feel better. °· Do not have sex until treatment is completed. °· Tell all sexual partners that you have a vaginal infection. They should see their health care provider and be treated if they have problems, such as a mild rash or itching. °· Practice safe sex by using condoms and only having one sex partner. °SEEK MEDICAL CARE IF:  °· Your symptoms are not improving after 3 days of treatment. °· You have increased discharge or pain. °· You have a fever. °MAKE SURE YOU:  °· Understand these instructions. °· Will watch your condition. °· Will get help right away if you are not doing well or get worse. °FOR MORE INFORMATION  °Centers for Disease Control and Prevention, Division of STD Prevention: www.cdc.gov/std °American Sexual Health Association (ASHA): www.ashastd.org  °Document Released: 11/30/2005 Document Revised: 09/20/2013 Document Reviewed: 07/12/2013 °ExitCare® Patient Information ©2015 [employer]. This information is not intended to replace advice given to you by your health care provider. Make sure you discuss any questions you have with your health care provider. ° °Vaginitis °Vaginitis is an inflammation of the vagina. It is most often caused by a change in the normal balance of the bacteria and yeast that live in the vagina. This change in balance causes an overgrowth of certain bacteria or yeast, which causes the inflammation. There are different types of vaginitis, but the most common types are: °· Bacterial vaginosis. °· Yeast infection (candidiasis). °· Trichomoniasis vaginitis. This is a sexually transmitted infection (STI). °· Viral vaginitis. °· Atropic vaginitis. °· Allergic vaginitis. °  CAUSES  The cause depends on the type of vaginitis. Vaginitis can be caused by:  Bacteria (bacterial vaginosis).  Yeast (yeast infection).  A parasite  (trichomoniasis vaginitis)  A virus (viral vaginitis).  Low hormone levels (atrophic vaginitis). Low hormone levels can occur during pregnancy, breastfeeding, or after menopause.  Irritants, such as bubble baths, scented tampons, and feminine sprays (allergic vaginitis). Other factors can change the normal balance of the yeast and bacteria that live in the vagina. These include:  Antibiotic medicines.  Poor hygiene.  Diaphragms, vaginal sponges, spermicides, birth control pills, and intrauterine devices (IUD).  Sexual intercourse.  Infection.  Uncontrolled diabetes.  A weakened immune system. SYMPTOMS  Symptoms can vary depending on the cause of the vaginitis. Common symptoms include:  Abnormal vaginal discharge.  The discharge is white, gray, or yellow with bacterial vaginosis.  The discharge is thick, white, and cheesy with a yeast infection.  The discharge is frothy and yellow or greenish with trichomoniasis.  A bad vaginal odor.  The odor is fishy with bacterial vaginosis.  Vaginal itching, pain, or swelling.  Painful intercourse.  Pain or burning when urinating. Sometimes, there are no symptoms. TREATMENT  Treatment will vary depending on the type of infection.   Bacterial vaginosis and trichomoniasis are often treated with antibiotic creams or pills.  Yeast infections are often treated with antifungal medicines, such as vaginal creams or suppositories.  Viral vaginitis has no cure, but symptoms can be treated with medicines that relieve discomfort. Your sexual partner should be treated as well.  Atrophic vaginitis may be treated with an estrogen cream, pill, suppository, or vaginal ring. If vaginal dryness occurs, lubricants and moisturizing creams may help. You may be told to avoid scented soaps, sprays, or douches.  Allergic vaginitis treatment involves quitting the use of the product that is causing the problem. Vaginal creams can be used to treat the  symptoms. HOME CARE INSTRUCTIONS   Take all medicines as directed by your caregiver.  Keep your genital area clean and dry. Avoid soap and only rinse the area with water.  Avoid douching. It can remove the healthy bacteria in the vagina.  Do not use tampons or have sexual intercourse until your vaginitis has been treated. Use sanitary pads while you have vaginitis.  Wipe from front to back. This avoids the spread of bacteria from the rectum to the vagina.  Let air reach your genital area.  Wear cotton underwear to decrease moisture buildup.  Avoid wearing underwear while you sleep until your vaginitis is gone.  Avoid tight pants and underwear or nylons without a cotton panel.  Take off wet clothing (especially bathing suits) as soon as possible.  Use mild, non-scented products. Avoid using irritants, such as:  Scented feminine sprays.  Fabric softeners.  Scented detergents.  Scented tampons.  Scented soaps or bubble baths.  Practice safe sex and use condoms. Condoms may prevent the spread of trichomoniasis and viral vaginitis. SEEK MEDICAL CARE IF:   You have abdominal pain.  You have a fever or persistent symptoms for more than 2-3 days.  You have a fever and your symptoms suddenly get worse. Document Released: 09/27/2007 Document Revised: 08/24/2012 Document Reviewed: 05/12/2012 [facility] Patient Information 2015 [place], [place]. This information is not intended to replace advice given to you by your health care provider. Make sure you discuss any questions you have with your health care provider.  Urinary Tract Infection Urinary tract infections (UTIs) can develop anywhere along your urinary tract. Your urinary  tract is your body's drainage system for removing wastes and extra water. Your urinary tract includes two kidneys, two ureters, a bladder, and a urethra. Your kidneys are a pair of bean-shaped organs. Each kidney is about the size of your fist. They are located  below your ribs, one on each side of your spine. CAUSES Infections are caused by microbes, which are microscopic organisms, including fungi, viruses, and bacteria. These organisms are so small that they can only be seen through a microscope. Bacteria are the microbes that most commonly cause UTIs. SYMPTOMS  Symptoms of UTIs may vary by age and gender of the patient and by the location of the infection. Symptoms in young women typically include a frequent and intense urge to urinate and a painful, burning feeling in the bladder or urethra during urination. Older women and men are more likely to be tired, shaky, and weak and have muscle aches and abdominal pain. A fever may mean the infection is in your kidneys. Other symptoms of a kidney infection include pain in your back or sides below the ribs, nausea, and vomiting. DIAGNOSIS To diagnose a UTI, your caregiver will ask you about your symptoms. Your caregiver also will ask to provide a urine sample. The urine sample will be tested for bacteria and white blood cells. White blood cells are made by your body to help fight infection. TREATMENT  Typically, UTIs can be treated with medication. Because most UTIs are caused by a bacterial infection, they usually can be treated with the use of antibiotics. The choice of antibiotic and length of treatment depend on your symptoms and the type of bacteria causing your infection. HOME CARE INSTRUCTIONS  If you were prescribed antibiotics, take them exactly as your caregiver instructs you. Finish the medication even if you feel better after you have only taken some of the medication.  Drink enough water and fluids to keep your urine clear or pale yellow.  Avoid caffeine, tea, and carbonated beverages. They tend to irritate your bladder.  Empty your bladder often. Avoid holding urine for long periods of time.  Empty your bladder before and after sexual intercourse.  After a bowel movement, women should cleanse  from front to back. Use each tissue only once. SEEK MEDICAL CARE IF:   You have back pain.  You develop a fever.  Your symptoms do not begin to resolve within 3 days. SEEK IMMEDIATE MEDICAL CARE IF:   You have severe back pain or lower abdominal pain.  You develop chills.  You have nausea or vomiting.  You have continued burning or discomfort with urination. MAKE SURE YOU:   Understand these instructions.  Will watch your condition.  Will get help right away if you are not doing well or get worse. Document Released: 09/09/2005 Document Revised: 05/31/2012 Document Reviewed: 01/08/2012 [facility] Patient Information 2015 [place], [place]. This information is not intended to replace advice given to you by your health care provider. Make sure you discuss any questions you have with your health care provider.

## 2015-02-11 LAB — URINE CULTURE
Colony Count: >=100000
SPECIAL REQUESTS: NORMAL

## 2015-02-11 LAB — CERVICOVAGINAL ANCILLARY ONLY
Chlamydia: NEGATIVE
Neisseria Gonorrhea: NEGATIVE

## 2015-02-12 LAB — CERVICOVAGINAL ANCILLARY ONLY
WET PREP (BD AFFIRM): NEGATIVE
WET PREP (BD AFFIRM): POSITIVE — AB
Wet Prep (BD Affirm): NEGATIVE

## 2015-02-12 NOTE — ED Notes (Signed)
GC/Chlamydia neg., Affrim: Candida and Trich neg., Gardnerella pos.  Pt. adequately treated with Flagyl.  Urine culture: >100,000 colonies Proteus Mirabilis and adequately treated with Keflex. Brianna Jimenez 02/12/2015

## 2015-03-18 ENCOUNTER — Encounter (HOSPITAL_COMMUNITY): Payer: Self-pay | Admitting: Emergency Medicine

## 2015-03-18 ENCOUNTER — Emergency Department (HOSPITAL_COMMUNITY)
Admission: EM | Admit: 2015-03-18 | Discharge: 2015-03-18 | Disposition: A | Discharge status: Home / Self Care | Payer: 59 | Attending: Emergency Medicine | Admitting: Emergency Medicine

## 2015-03-18 ENCOUNTER — Emergency Department (HOSPITAL_COMMUNITY): Payer: 59

## 2015-03-18 DIAGNOSIS — K529 Noninfective gastroenteritis and colitis, unspecified: Secondary | ICD-10-CM | POA: Diagnosis not present

## 2015-03-18 DIAGNOSIS — Z792 Long term (current) use of antibiotics: Secondary | ICD-10-CM | POA: Insufficient documentation

## 2015-03-18 DIAGNOSIS — Z8619 Personal history of other infectious and parasitic diseases: Secondary | ICD-10-CM | POA: Diagnosis not present

## 2015-03-18 DIAGNOSIS — R1031 Right lower quadrant pain: Secondary | ICD-10-CM | POA: Diagnosis present

## 2015-03-18 DIAGNOSIS — R52 Pain, unspecified: Secondary | ICD-10-CM

## 2015-03-18 DIAGNOSIS — Z720 Tobacco use: Secondary | ICD-10-CM | POA: Insufficient documentation

## 2015-03-18 DIAGNOSIS — Z3202 Encounter for pregnancy test, result negative: Secondary | ICD-10-CM | POA: Diagnosis not present

## 2015-03-18 LAB — POC URINE PREG, ED: Preg Test, Ur: NEGATIVE

## 2015-03-18 LAB — URINALYSIS, ROUTINE W REFLEX MICROSCOPIC
Glucose, UA: 100 mg/dL — AB
LEUKOCYTES UA: NEGATIVE
NITRITE: NEGATIVE
PH: 5.5 (ref 5.0–8.0)
Protein, ur: NEGATIVE mg/dL
Specific Gravity, Urine: 1.025 (ref 1.005–1.030)
UROBILINOGEN UA: 1 mg/dL (ref 0.0–1.0)

## 2015-03-18 LAB — CBC WITH DIFFERENTIAL/PLATELET
Basophils Absolute: 0 10*3/uL (ref 0.0–0.1)
Basophils Relative: 0 % (ref 0–1)
EOS ABS: 0 10*3/uL (ref 0.0–0.7)
Eosinophils Relative: 0 % (ref 0–5)
HCT: 43 % (ref 36.0–46.0)
Hemoglobin: 13.9 g/dL (ref 12.0–15.0)
LYMPHS ABS: 1.4 10*3/uL (ref 0.7–4.0)
LYMPHS PCT: 10 % — AB (ref 12–46)
MCH: 26.4 pg (ref 26.0–34.0)
MCHC: 32.3 g/dL (ref 30.0–36.0)
MCV: 81.6 fL (ref 78.0–100.0)
Monocytes Absolute: 1 10*3/uL (ref 0.1–1.0)
Monocytes Relative: 8 % (ref 3–12)
NEUTROS PCT: 82 % — AB (ref 43–77)
Neutro Abs: 10.7 10*3/uL — ABNORMAL HIGH (ref 1.7–7.7)
PLATELETS: 256 10*3/uL (ref 150–400)
RBC: 5.27 MIL/uL — AB (ref 3.87–5.11)
RDW: 13.4 % (ref 11.5–15.5)
WBC: 13.1 10*3/uL — AB (ref 4.0–10.5)

## 2015-03-18 LAB — URINE MICROSCOPIC-ADD ON

## 2015-03-18 LAB — I-STAT CHEM 8, ED
BUN: 7 mg/dL (ref 6–23)
CHLORIDE: 99 mmol/L (ref 96–112)
Calcium, Ion: 1.24 mmol/L — ABNORMAL HIGH (ref 1.12–1.23)
Creatinine, Ser: 0.8 mg/dL (ref 0.50–1.10)
Glucose, Bld: 138 mg/dL — ABNORMAL HIGH (ref 70–99)
HCT: 48 % — ABNORMAL HIGH (ref 36.0–46.0)
Hemoglobin: 16.3 g/dL — ABNORMAL HIGH (ref 12.0–15.0)
Potassium: 3.3 mmol/L — ABNORMAL LOW (ref 3.5–5.1)
SODIUM: 139 mmol/L (ref 135–145)
TCO2: 23 mmol/L (ref 0–100)

## 2015-03-18 MED ORDER — CIPROFLOXACIN HCL 500 MG PO TABS: 500.0000 mg | ORAL_TABLET | Freq: Two times a day (BID) | ORAL | Status: DC

## 2015-03-18 MED ORDER — IOHEXOL 300 MG/ML  SOLN
25.0000 mL | INTRAMUSCULAR | Status: AC
  Administered 2015-03-18: 25 mL via ORAL

## 2015-03-18 MED ORDER — IOHEXOL 300 MG/ML  SOLN
100.0000 mL | Freq: Once | INTRAMUSCULAR | Status: AC | PRN
  Administered 2015-03-18: 100 mL via INTRAVENOUS

## 2015-03-18 MED ORDER — SODIUM CHLORIDE 0.9 % IV BOLUS (SEPSIS)
1000.0000 mL | Freq: Once | INTRAVENOUS | Status: AC
  Administered 2015-03-18: 1000 mL via INTRAVENOUS

## 2015-03-18 MED ORDER — METRONIDAZOLE 500 MG PO TABS: 500.0000 mg | ORAL_TABLET | Freq: Two times a day (BID) | ORAL | Status: DC

## 2015-03-18 NOTE — Discharge Instructions (Signed)
Abdominal Pain  Many things can cause abdominal pain. Usually, abdominal pain is not caused by a disease and will improve without treatment. It can often be observed and treated at home. Your health care provider will do a physical exam and possibly order blood tests and X-rays to help determine the seriousness of your pain. However, in many cases, more time must pass before a clear cause of the pain can be found. Before that point, your health care provider may not know if you need more testing or further treatment.  HOME CARE INSTRUCTIONS   Monitor your abdominal pain for any changes. The following actions may help to alleviate any discomfort you are experiencing:   Only take over-the-counter or prescription medicines as directed by your health care provider.   Do not take laxatives unless directed to do so by your health care provider.   Try a clear liquid diet (broth, tea, or water) as directed by your health care provider. Slowly move to a bland diet as tolerated.  SEEK MEDICAL CARE IF:   You have unexplained abdominal pain.   You have abdominal pain associated with nausea or diarrhea.   You have pain when you urinate or have a bowel movement.   You experience abdominal pain that wakes you in the night.   You have abdominal pain that is worsened or improved by eating food.   You have abdominal pain that is worsened with eating fatty foods.   You have a fever.  SEEK IMMEDIATE MEDICAL CARE IF:    Your pain does not go away within 2 hours.   You keep throwing up (vomiting).   Your pain is felt only in portions of the abdomen, such as the right side or the left lower portion of the abdomen.   You pass bloody or black tarry stools.  MAKE SURE YOU:   Understand these instructions.    Will watch your condition.    Will get help right away if you are not doing well or get worse.   Document Released: 09/09/2005 Document Revised: 12/05/2013 Document Reviewed: 08/09/2013  ExitCare Patient Information  2015 [employer]. This information is not intended to replace advice given to you by your health care provider. Make sure you discuss any questions you have with your health care provider.  Colitis  Colitis is inflammation of the colon. Colitis can be a short-term or long-standing (chronic) illness. Crohn's disease and ulcerative colitis are 2 types of colitis which are chronic. They usually require lifelong treatment.  CAUSES   There are many different causes of colitis, including:   Viruses.   Germs (bacteria).   Medicine reactions.  SYMPTOMS    Diarrhea.   Intestinal bleeding.   Pain.   Fever.   Throwing up (vomiting).   Tiredness (fatigue).   Weight loss.   Bowel blockage.  DIAGNOSIS   The diagnosis of colitis is based on examination and stool or blood tests. X-rays, CT scan, and colonoscopy may also be needed.  TREATMENT   Treatment may include:   Fluids given through the vein (intravenously).   Bowel rest (nothing to eat or drink for a period of time).   Medicine for pain and diarrhea.   Medicines (antibiotics) that kill germs.   Cortisone medicines.   Surgery.  HOME CARE INSTRUCTIONS    Get plenty of rest.   Drink enough water and fluids to keep your urine clear or pale yellow.   Eat a well-balanced diet.   Call your caregiver   for follow-up as recommended.  SEEK IMMEDIATE MEDICAL CARE IF:    You develop chills.   You have an oral temperature above 102 F (38.9 C), not controlled by medicine.   You have extreme weakness, fainting, or dehydration.   You have repeated vomiting.   You develop severe belly (abdominal) pain or are passing bloody or tarry stools.  MAKE SURE YOU:    Understand these instructions.   Will watch your condition.   Will get help right away if you are not doing well or get worse.  Document Released: 01/07/2005 Document Revised: 02/22/2012 Document Reviewed: 04/04/2010  Exit[employer] Patient Information 2015 [employer]. This information is not intended to  replace advice given to you by your health care provider. Make sure you discuss any questions you have with your health care provider.

## 2015-03-18 NOTE — ED Notes (Signed)
Pt. Left with all belongings and refused wheelchair 

## 2015-03-18 NOTE — ED Provider Notes (Signed)
CSN: [id number]     Arrival date & time 03/18/15  1516 History   None    Chief Complaint  Patient presents with  . Abdominal Pain     (Consider location/radiation/quality/duration/timing/severity/associated sxs/prior Treatment) Patient is a 29 y.o. female presenting with abdominal pain. The history is provided by the patient. No language interpreter was used.  Abdominal Pain Pain location:  RLQ Pain quality: aching and fullness   Pain radiates to:  Does not radiate Pain severity:  Moderate Onset quality:  Gradual Duration:  3 days Timing:  Constant Progression:  Worsening Chronicity:  New Context: sick contacts   Relieved by:  Nothing Worsened by:  Nothing tried Ineffective treatments:  None tried Associated symptoms: anorexia, fever and nausea   Risk factors: has not had multiple surgeries   Pt reports she had vomitting and diarrhea on Saturday.   Pt reports vomiting and diarhea stopped but she continues to have increasing pain.    Past Medical History  Diagnosis Date  . No pertinent past medical history   . Chlamydia   . Trichomonas    Past Surgical History  Procedure Laterality Date  . Dilation and curettage of uterus     No family history on file. History  Substance Use Topics  . Smoking status: Current Every Day Smoker -- 0.50 packs/day    Types: Cigarettes  . Smokeless tobacco: Never Used  . Alcohol Use: 2.4 oz/week    4 Shots of liquor per week     Comment: occasional   OB History    Gravida Para Term Preterm AB TAB SAB Ectopic Multiple Living   2 1 0 0 1 0 0 0 0 1      Review of Systems  Constitutional: Positive for fever.  Gastrointestinal: Positive for nausea, abdominal pain and anorexia.  All other systems reviewed and are negative.     Allergies  Review of patient's allergies indicates no known allergies.  Home Medications   Prior to Admission medications   Medication Sig Start Date End Date Taking? Authorizing Provider  cephALEXin  (KEFLEX) 500 MG capsule Take 1 capsule (500 mg total) by mouth 4 (four) times daily. 02/08/15  Yes Janne Napoleon, NP  metroNIDAZOLE (FLAGYL) 500 MG tablet Take 1 tablet (500 mg total) by mouth 2 (two) times daily. X 7 days 02/08/15  Yes Janne Napoleon, NP   BP 129/76 mmHg  Pulse 104  Temp(Src) 98.5 F (36.9 C)  Resp 18  Ht 5\' 5"  (1.651 m)  Wt 138 lb (62.596 kg)  BMI 22.96 kg/m2  SpO2 99%  LMP 03/04/2015 Physical Exam  Constitutional: She is oriented to person, place, and time. She appears well-developed and well-nourished.  HENT:  Head: Normocephalic and atraumatic.  Mouth/Throat: Oropharynx is clear and moist.  Eyes: Conjunctivae and EOM are normal. Pupils are equal, round, and reactive to light.  Neck: Normal range of motion.  Cardiovascular: Normal rate and normal heart sounds.   Pulmonary/Chest: Effort normal and breath sounds normal.  Abdominal: Soft. She exhibits no distension.  Musculoskeletal: Normal range of motion.  Neurological: She is alert and oriented to person, place, and time. She has normal reflexes.  Skin: Skin is warm and dry.  Psychiatric: She has a normal mood and affect.  Nursing note and vitals reviewed.   ED Course  Procedures (including critical care time) Labs Review Labs Reviewed  CBC WITH DIFFERENTIAL/PLATELET - Abnormal; Notable for the following:    WBC 13.1 (*)    RBC 5.27 (*)  Neutrophils Relative % 82 (*)    Neutro Abs 10.7 (*)    Lymphocytes Relative 10 (*)    All other components within normal limits  I-STAT CHEM 8, ED - Abnormal; Notable for the following:    Potassium 3.3 (*)    Glucose, Bld 138 (*)    Calcium, Ion 1.24 (*)    Hemoglobin 16.3 (*)    HCT 48.0 (*)    All other components within normal limits  URINALYSIS, ROUTINE W REFLEX MICROSCOPIC  POC URINE PREG, ED    Imaging Review Ct Abdomen Pelvis W Contrast  03/18/2015   CLINICAL DATA:  Patient with right lower quadrant abdominal pain for 3 days.  EXAM: CT ABDOMEN AND PELVIS  WITH CONTRAST  TECHNIQUE: Multidetector CT imaging of the abdomen and pelvis was performed using the standard protocol following bolus administration of intravenous contrast.  CONTRAST:  1101mL OMNIPAQUE IOHEXOL 300 MG/ML  SOLN  COMPARISON:  None  FINDINGS: Lower chest: No consolidative or nodular pulmonary opacities. Normal heart size.  Hepatobiliary: Liver is normal in size and contour without focal hepatic lesion identified. Gallbladder is unremarkable. No intrahepatic or extrahepatic biliary ductal dilatation.  Pancreas: Unremarkable  Spleen: Unremarkable  Adrenals/Urinary Tract: Normal bilateral adrenal glands. Kidneys enhance symmetrically with contrast. No hydronephrosis. Urinary bladder is unremarkable.  Stomach/Bowel: There is wall thickening of the mesenteric aspect of the ascending colon with extensive associated adjacent mesenteric fat stranding. The appendix is normal and opacified with contrast. The terminal ileum appears unremarkable. No free intraperitoneal air.  Vascular/Lymphatic: Normal caliber abdominal aorta. No retroperitoneal lymphadenopathy. Multiple prominent and enlarged right lower quadrant mesenteric lymph nodes are demonstrated measuring up to 11 mm (image 52; series 4).  Other: The uterus and adnexal structures are unremarkable. Small amount of free fluid in the pelvis.  Musculoskeletal:  No aggressive or acute appearing osseous lesions.  IMPRESSION: Nonspecific wall thickening of the anti mesenteric border of the ascending colon with associated extensive adjacent mesenteric fat stranding. Findings are nonspecific however may be secondary to an acute colitis. The appendix and terminal ileum are unremarkable. There are multiple adjacent mesenteric lymph nodes which are prominent to enlarged, likely reactive in etiology.   Electronically Signed   By: Lovey Newcomer M.D.   On: 03/18/2015 21:18     EKG Interpretation None      MDM  Pt has elevated wbc's.  Ct scan obtained and shows  colitis, no appendicitis.  Pt given rx for flagyl and cipro.  Pt is advised of need for recheck.    Final diagnoses:  Colitis    AVS Cipro Flagyl    Fransico Meadow, PA-C 03/19/15 1832  Pattricia Boss, MD 03/26/15 1402

## 2015-03-18 NOTE — ED Notes (Signed)
Pt st's after eating Sat night she started having abd cramping with nausea, vomiting and diarrhea.  St's since then the vomiting and diarrhea has stopped but continues to have abd cramping.

## 2016-02-17 ENCOUNTER — Inpatient Hospital Stay (HOSPITAL_COMMUNITY): Payer: Self-pay

## 2016-02-17 ENCOUNTER — Encounter (HOSPITAL_COMMUNITY): Payer: Self-pay | Admitting: *Deleted

## 2016-02-17 ENCOUNTER — Inpatient Hospital Stay (HOSPITAL_COMMUNITY)
Admission: AD | Admit: 2016-02-17 | Discharge: 2016-02-17 | Disposition: A | Discharge status: Home / Self Care | Payer: Self-pay | Source: Ambulatory Visit | Attending: Family Medicine | Admitting: Family Medicine

## 2016-02-17 DIAGNOSIS — O99331 Smoking (tobacco) complicating pregnancy, first trimester: Secondary | ICD-10-CM | POA: Insufficient documentation

## 2016-02-17 DIAGNOSIS — B9689 Other specified bacterial agents as the cause of diseases classified elsewhere: Secondary | ICD-10-CM

## 2016-02-17 DIAGNOSIS — N9489 Other specified conditions associated with female genital organs and menstrual cycle: Secondary | ICD-10-CM

## 2016-02-17 DIAGNOSIS — N760 Acute vaginitis: Secondary | ICD-10-CM

## 2016-02-17 DIAGNOSIS — F17210 Nicotine dependence, cigarettes, uncomplicated: Secondary | ICD-10-CM | POA: Insufficient documentation

## 2016-02-17 DIAGNOSIS — O2341 Unspecified infection of urinary tract in pregnancy, first trimester: Secondary | ICD-10-CM | POA: Insufficient documentation

## 2016-02-17 DIAGNOSIS — O26899 Other specified pregnancy related conditions, unspecified trimester: Secondary | ICD-10-CM

## 2016-02-17 DIAGNOSIS — O4691 Antepartum hemorrhage, unspecified, first trimester: Secondary | ICD-10-CM

## 2016-02-17 DIAGNOSIS — Z3A01 Less than 8 weeks gestation of pregnancy: Secondary | ICD-10-CM | POA: Insufficient documentation

## 2016-02-17 DIAGNOSIS — R109 Unspecified abdominal pain: Secondary | ICD-10-CM | POA: Insufficient documentation

## 2016-02-17 DIAGNOSIS — O23591 Infection of other part of genital tract in pregnancy, first trimester: Secondary | ICD-10-CM | POA: Insufficient documentation

## 2016-02-17 DIAGNOSIS — O209 Hemorrhage in early pregnancy, unspecified: Secondary | ICD-10-CM | POA: Insufficient documentation

## 2016-02-17 DIAGNOSIS — A499 Bacterial infection, unspecified: Secondary | ICD-10-CM

## 2016-02-17 LAB — CBC
HCT: 41.7 % (ref 36.0–46.0)
Hemoglobin: 13.7 g/dL (ref 12.0–15.0)
MCH: 27 pg (ref 26.0–34.0)
MCHC: 32.9 g/dL (ref 30.0–36.0)
MCV: 82.2 fL (ref 78.0–100.0)
PLATELETS: 280 10*3/uL (ref 150–400)
RBC: 5.07 MIL/uL (ref 3.87–5.11)
RDW: 13.4 % (ref 11.5–15.5)
WBC: 8.1 10*3/uL (ref 4.0–10.5)

## 2016-02-17 LAB — WET PREP, GENITAL
Sperm: NONE SEEN
TRICH WET PREP: NONE SEEN
Yeast Wet Prep HPF POC: NONE SEEN

## 2016-02-17 LAB — URINALYSIS, ROUTINE W REFLEX MICROSCOPIC
BILIRUBIN URINE: NEGATIVE
Glucose, UA: NEGATIVE mg/dL
KETONES UR: NEGATIVE mg/dL
Leukocytes, UA: NEGATIVE
NITRITE: NEGATIVE
Protein, ur: NEGATIVE mg/dL
pH: 5.5 (ref 5.0–8.0)

## 2016-02-17 LAB — URINE MICROSCOPIC-ADD ON

## 2016-02-17 LAB — ABO/RH: ABO/RH(D): A POS

## 2016-02-17 LAB — POCT PREGNANCY, URINE: PREG TEST UR: POSITIVE — AB

## 2016-02-17 LAB — HCG, QUANTITATIVE, PREGNANCY: hCG, Beta Chain, Quant, S: 384 m[IU]/mL — ABNORMAL HIGH (ref <5)

## 2016-02-17 MED ORDER — CEPHALEXIN 500 MG PO CAPS: 500.0000 mg | ORAL_CAPSULE | Freq: Four times a day (QID) | ORAL | Status: DC

## 2016-02-17 MED ORDER — METRONIDAZOLE 0.75 % VA GEL: 1.0000 | Freq: Two times a day (BID) | VAGINAL | Status: DC

## 2016-02-17 NOTE — MAU Note (Signed)
Patient states her LMP was 01/06/16 and she took a + HPT on Friday.  She has been having abdominal cramping for past two weeks and noticed blood on the toilet paper when she went to the bathroom this morning.  Denies vaginal itching, irritation, or discharge.

## 2016-02-17 NOTE — Discharge Instructions (Signed)
Pelvic Rest Pelvic rest is sometimes recommended for women when:   The placenta is partially or completely covering the opening of the cervix (placenta previa).  There is bleeding between the uterine wall and the amniotic sac in the first trimester (subchorionic hemorrhage).  The cervix begins to open without labor starting (incompetent cervix, cervical insufficiency).  The labor is too early (preterm labor). HOME CARE INSTRUCTIONS  Do not have sexual intercourse, stimulation, or an orgasm.  Do not use tampons, douche, or put anything in the vagina.  Do not lift anything over 10 pounds (4.5 kg).  Avoid strenuous activity or straining your pelvic muscles. SEEK MEDICAL CARE IF:  You have any vaginal bleeding during pregnancy. Treat this as a potential emergency.  You have cramping pain felt low in the stomach (stronger than menstrual cramps).  You notice vaginal discharge (watery, mucus, or bloody).  You have a low, dull backache.  There are regular contractions or uterine tightening. SEEK IMMEDIATE MEDICAL CARE IF: You have vaginal bleeding and have placenta previa.    This information is not intended to replace advice given to you by your health care provider. Make sure you discuss any questions you have with your health care provider.   Document Released: 03/27/2011 Document Revised: 02/22/2012 Document Reviewed: 06/03/2015 Elsevier Interactive Patient Education [employer].

## 2016-02-17 NOTE — MAU Provider Note (Signed)
History     CSN: [id number]  Arrival date and time: 02/17/16 [id number]   First Provider Initiated Contact with Patient 02/17/16 [phone]      Chief Complaint  Patient presents with  . Abdominal Cramping  . Vaginal Bleeding   HPI   Ms.Brianna Jimenez is a 30 y.o. female G3P0011 @ [redacted]w[redacted]d presenting to MAU with abdominal cramping and vaginal bleeding. She has a history of of irregular periods, however is pretty certain of her LMP.  She had a positive pregnancy test on Friday at home.   She complains of off and on cramping for 2 weeks; the cramping is located in Lower abdomen, worse in the center of her lower abdomen; patient currently rates it a 4/10.  She urinated this morning and saw blood in the toilet and on the tissue which is what brought her into MAU.   Desired pregnancy; pregnancy was planned.    OB History    Gravida Para Term Preterm AB TAB SAB Ectopic Multiple Living   3 1 0 0 1 0 0 0 0 1       Past Medical History  Diagnosis Date  . No pertinent past medical history   . Chlamydia   . Trichomonas     Past Surgical History  Procedure Laterality Date  . Dilation and curettage of uterus      No family history on file.  Social History  Substance Use Topics  . Smoking status: Current Every Day Smoker -- 0.50 packs/day    Types: Cigarettes  . Smokeless tobacco: Never Used  . Alcohol Use: Yes     Comment: occasional    Allergies: No Known Allergies  Prescriptions prior to admission  Medication Sig Dispense Refill Last Dose  . phenylephrine-shark liver oil-mineral oil-petrolatum (PREPARATION H) 0.25-3-14-71.9 % rectal ointment Place 1 application rectally 2 (two) times daily as needed for hemorrhoids.   02/13/2016  . cephALEXin (KEFLEX) 500 MG capsule Take 1 capsule (500 mg total) by mouth 4 (four) times daily. (Patient not taking: Reported on 02/17/2016) 28 capsule 0 Past Week at Unknown time  . ciprofloxacin (CIPRO) 500 MG tablet Take 1 tablet (500 mg total) by mouth  2 (two) times daily. (Patient not taking: Reported on 02/17/2016) 20 tablet 0   . metroNIDAZOLE (FLAGYL) 500 MG tablet Take 1 tablet (500 mg total) by mouth 2 (two) times daily. (Patient not taking: Reported on 02/17/2016) 14 tablet 0    Results for orders placed or performed during the hospital encounter of 02/17/16 (from the past 72 hour(s))  Urinalysis, Routine w reflex microscopic (not at [facility])     Status: Abnormal   Collection Time: 02/17/16  8:18 AM  Result Value Ref Range   Color, Urine YELLOW YELLOW   APPearance CLEAR CLEAR   Specific Gravity, Urine >1.030 (H) 1.005 - 1.030   pH 5.5 5.0 - 8.0   Glucose, UA NEGATIVE NEGATIVE mg/dL   Hgb urine dipstick MODERATE (A) NEGATIVE   Bilirubin Urine NEGATIVE NEGATIVE   Ketones, ur NEGATIVE NEGATIVE mg/dL   Protein, ur NEGATIVE NEGATIVE mg/dL   Nitrite NEGATIVE NEGATIVE   Leukocytes, UA NEGATIVE NEGATIVE  Pregnancy, urine POC     Status: Abnormal   Collection Time: 02/17/16  8:18 AM  Result Value Ref Range   Preg Test, Ur POSITIVE (A) NEGATIVE    Comment:        THE SENSITIVITY OF THIS METHODOLOGY IS >24 mIU/mL   Urine microscopic-add on     Status: Abnormal  Collection Time: 02/17/16  8:18 AM  Result Value Ref Range   Squamous Epithelial / LPF 6-30 (A) NONE SEEN   WBC, UA 0-5 0 - 5 WBC/hpf   RBC / HPF 0-5 0 - 5 RBC/hpf   Bacteria, UA MANY (A) NONE SEEN   Crystals CA OXALATE CRYSTALS (A) NEGATIVE   Urine-Other MUCOUS PRESENT   CBC     Status: None   Collection Time: 02/17/16  9:01 AM  Result Value Ref Range   WBC 8.1 4.0 - 10.5 K/uL   RBC 5.07 3.87 - 5.11 MIL/uL   Hemoglobin 13.7 12.0 - 15.0 g/dL   HCT 41.7 36.0 - 46.0 %   MCV 82.2 78.0 - 100.0 fL   MCH 27.0 26.0 - 34.0 pg   MCHC 32.9 30.0 - 36.0 g/dL   RDW 13.4 11.5 - 15.5 %   Platelets 280 150 - 400 K/uL  ABO/Rh     Status: None (Preliminary result)   Collection Time: 02/17/16  9:03 AM  Result Value Ref Range   ABO/RH(D) A POS   hCG, quantitative, pregnancy     Status:  Abnormal   Collection Time: 02/17/16  9:03 AM  Result Value Ref Range   hCG, Beta Chain, Quant, S 384 (H) <5 mIU/mL    Comment:          GEST. AGE      CONC.  (mIU/mL)   <=1 WEEK        5 - 50     2 WEEKS       50 - 500     3 WEEKS       100 - 10,000     4 WEEKS     1,000 - 30,000     5 WEEKS     3,500 - 115,000   6-8 WEEKS     12,000 - 270,000    12 WEEKS     15,000 - 220,000        FEMALE AND NON-PREGNANT FEMALE:     LESS THAN 5 mIU/mL   Wet prep, genital     Status: Abnormal   Collection Time: 02/17/16  9:04 AM  Result Value Ref Range   Yeast Wet Prep HPF POC NONE SEEN NONE SEEN   Trich, Wet Prep NONE SEEN NONE SEEN   Clue Cells Wet Prep HPF POC PRESENT (A) NONE SEEN   WBC, Wet Prep HPF POC MODERATE (A) NONE SEEN    Comment: MANY BACTERIA SEEN   Sperm NONE SEEN    US Ob Comp Less 14 Wks  02/17/2016  CLINICAL DATA:  30 year old pregnant female presents with abdominal pain. Quantitative beta HCG 384. EDC by LMP: 10/12/2016, projecting to an expected gestational age of [redacted] weeks 0 days. EXAM: OBSTETRIC <14 WK [place] AND TRANSVAGINAL OB US TECHNIQUE: Both transabdominal and transvaginal ultrasound examinations were performed for complete evaluation of the gestation as well as the maternal uterus, adnexal regions, and pelvic cul-de-sac. Transvaginal technique was performed to assess early pregnancy. COMPARISON:  No prior scans from this gestation. FINDINGS: The anteverted anteflexed uterus measures 8.0 x 4.5 x 5.0 cm in size. There are no uterine fibroids or other myometrial abnormalities. The endometrium measures 21 mm in bilayer thickness and appears mildly heterogeneous. There is no evidence of an intrauterine gestational sac. No endometrial cavity fluid or focal endometrial mass are demonstrated. The right ovary measures 3.4 x 1.9 x 2.8 cm and is normal. Between the right ovary and uterus, there is  a questionable partially cystic 0.8 x 0.6 cm right adnexal mass, with no appreciable embryo,  embryonic cardiac activity or internal vascularity. The left ovary measures 3.7 x 2.6 x 3.4 cm and contains a 2.0 x 1.7 x 1.5 cm corpus luteum. No abnormal free fluid is seen in the pelvis. IMPRESSION: Thickened (21 mm) endometrium with no evidence of an intrauterine gestational sac. Questionable partially cystic 0.8 x 0.6 cm right adnexal mass between the right ovary and uterus, cannot exclude a right tubal ectopic pregnancy. The differential diagnosis also includes an intrauterine gestation too early to visualize or spontaneous abortion. Close clinical follow-up with serial serum beta HCG levels and short-term follow-up obstetric scan is recommended. These results were called by telephone at the time of interpretation on 02/17/2016 at 10:46 am to NP [facility], who verbally acknowledged these results. Electronically Signed   By: Ilona Sorrel M.D.   On: 02/17/2016 10:48   US Ob Transvaginal  02/17/2016  CLINICAL DATA:  30 year old pregnant female presents with abdominal pain. Quantitative beta HCG 384. EDC by LMP: 10/12/2016, projecting to an expected gestational age of [redacted] weeks 0 days. EXAM: OBSTETRIC <14 WK [place] AND TRANSVAGINAL OB US TECHNIQUE: Both transabdominal and transvaginal ultrasound examinations were performed for complete evaluation of the gestation as well as the maternal uterus, adnexal regions, and pelvic cul-de-sac. Transvaginal technique was performed to assess early pregnancy. COMPARISON:  No prior scans from this gestation. FINDINGS: The anteverted anteflexed uterus measures 8.0 x 4.5 x 5.0 cm in size. There are no uterine fibroids or other myometrial abnormalities. The endometrium measures 21 mm in bilayer thickness and appears mildly heterogeneous. There is no evidence of an intrauterine gestational sac. No endometrial cavity fluid or focal endometrial mass are demonstrated. The right ovary measures 3.4 x 1.9 x 2.8 cm and is normal. Between the right ovary and uterus, there is a questionable  partially cystic 0.8 x 0.6 cm right adnexal mass, with no appreciable embryo, embryonic cardiac activity or internal vascularity. The left ovary measures 3.7 x 2.6 x 3.4 cm and contains a 2.0 x 1.7 x 1.5 cm corpus luteum. No abnormal free fluid is seen in the pelvis. IMPRESSION: Thickened (21 mm) endometrium with no evidence of an intrauterine gestational sac. Questionable partially cystic 0.8 x 0.6 cm right adnexal mass between the right ovary and uterus, cannot exclude a right tubal ectopic pregnancy. The differential diagnosis also includes an intrauterine gestation too early to visualize or spontaneous abortion. Close clinical follow-up with serial serum beta HCG levels and short-term follow-up obstetric scan is recommended. These results were called by telephone at the time of interpretation on 02/17/2016 at 10:46 am to NP [facility], who verbally acknowledged these results. Electronically Signed   By: Ilona Sorrel M.D.   On: 02/17/2016 10:48    Review of Systems  Constitutional: Negative for fever and chills.  Gastrointestinal: Positive for abdominal pain. Negative for nausea and vomiting.  Genitourinary: Negative for dysuria.   Physical Exam   Blood pressure 120/67, pulse 61, temperature 98.1 F (36.7 C), temperature source Oral, resp. rate 16, height 5\' 5"  (1.651 m), weight 147 lb 3.2 oz (66.769 kg), last menstrual period 01/06/2016, SpO2 100 %.  Physical Exam  Constitutional: She is oriented to person, place, and time. She appears well-developed and well-nourished. No distress.  Respiratory: Effort normal.  GI: Normal appearance. There is tenderness in the suprapubic area. There is no rigidity, no rebound, no guarding and no CVA tenderness.  Genitourinary:  Speculum  exam: Vagina - Small amount of creamy, pink discharge, mild odor Cervix - No contact bleeding, no active bleeding  Bimanual exam: Cervix closed Uterus non tender, normal size Adnexa non tender, no masses  bilaterally GC/Chlam, wet prep done Chaperone present for exam.  Musculoskeletal: Normal range of motion.  Neurological: She is alert and oriented to person, place, and time.  Skin: Skin is warm. She is not diaphoretic.  Psychiatric: Her behavior is normal.    MAU Course  Procedures  None  MDM  A positive blood type  Discussed physical exam and [place] with Dr. Nehemiah Settle patient having mild suprapubic pain consistent with UTI based on UA results. Urine culture sent Close follow up discussed.    Assessment and Plan    A:  1. Vaginal bleeding in pregnancy, first trimester   2. Abdominal pain in pregnancy, antepartum   3. BV (bacterial vaginosis)   4. Adnexal mass   5. UTI (urinary tract infection) during pregnancy, first trimester     P:  Discharge home in stable condition RX: Metrogel, keflex  Urine culture pending Strict ectopic precautions Pelvic rest  Go to the [facility] on Wednesday @ 11:00 or return to MAU if symptoms worsen   Lezlie Lye, NP 02/17/2016 2:50 PM

## 2016-02-18 LAB — URINE CULTURE: Special Requests: NORMAL

## 2016-02-18 LAB — HIV ANTIBODY (ROUTINE TESTING W REFLEX): HIV Screen 4th Generation wRfx: NONREACTIVE

## 2016-02-18 LAB — GC/CHLAMYDIA PROBE AMP (~~LOC~~) NOT AT ARMC
Chlamydia: NEGATIVE
NEISSERIA GONORRHEA: NEGATIVE

## 2016-02-19 ENCOUNTER — Encounter: Payer: Self-pay | Admitting: Obstetrics and Gynecology

## 2016-02-19 ENCOUNTER — Encounter: Payer: Self-pay | Admitting: *Deleted

## 2016-02-19 ENCOUNTER — Ambulatory Visit (INDEPENDENT_AMBULATORY_CARE_PROVIDER_SITE_OTHER): Payer: 59 | Admitting: Obstetrics and Gynecology

## 2016-02-19 DIAGNOSIS — O3680X0 Pregnancy with inconclusive fetal viability, not applicable or unspecified: Secondary | ICD-10-CM

## 2016-02-19 DIAGNOSIS — O0281 Inappropriate change in quantitative human chorionic gonadotropin (hCG) in early pregnancy: Secondary | ICD-10-CM

## 2016-02-19 LAB — HCG, QUANTITATIVE, PREGNANCY: hCG, Beta Chain, Quant, S: 45.6 m[IU]/mL — ABNORMAL HIGH

## 2016-02-19 NOTE — Progress Notes (Signed)
Patient ID: Brianna Jimenez, female   DOB: 07-04-1986, 30 y.o.   MRN: [id number] [name]  is a 30 y.o. G3P0011 at [redacted]w[redacted]d who presents to the [facility] today for follow-up quant hCG after 48 hours. The patient was seen in MAU on 3/6 and had quant hCG of 384 and US showed no IUP or adnexal mass. She denies abdominal or pelvic pain. She reports onset of light vaginal bleeding since yesterday evening.   OB History  Gravida Para Term Preterm AB SAB TAB Ectopic Multiple Living  3 1 0 0 1 0 1 0 0 1     # Outcome Date GA Lbr Len/2nd Weight Sex Delivery Anes PTL Lv  3 Current           2 TAB           1 Para      Vag-Spont         Past Medical History  Diagnosis Date  . No pertinent past medical history   . Chlamydia   . Trichomonas      LMP 01/06/2016 (Exact Date)  CONSTITUTIONAL: Well-developed, well-nourished female in no acute distress.  ENT: External right and left ear normal.  EYES: EOM intact, conjunctivae normal.  MUSCULOSKELETAL: Normal range of motion.  CARDIOVASCULAR: Regular heart rate RESPIRATORY: Normal effort NEUROLOGICAL: Alert and oriented to person, place, and time.  SKIN: Skin is warm and dry. No rash noted. Not diaphoretic. No erythema. No pallor. PSYCH: Normal mood and affect. Normal behavior. Normal judgment and thought content.  Results for orders placed or performed in visit on 02/19/16 (from the past 24 hour(s))  hCG, quantitative, pregnancy     Status: Abnormal   Collection Time: 02/19/16 11:10 AM  Result Value Ref Range   hCG, Beta Chain, Quant, S 45.6 (H) mIU/mL   Narrative   Performed at:  [place], [id number]                [place], [place] [address] SOURCE: BLD&BLOOD    A: Inappropriate rise in quant hCG after 48 hours, more consistent with spontaneous miscarriage  P: Discharge home Patient will return for follow-up HCG in 1 week.   Patient may return to MAU as needed or if her  condition were to change or worsen  This was a desired pregnancy and the patient desires to try to conceive again. Advised to continue prenatal vitamins and to wait until complete resolution of this pregnancy before trying to conceiving again  Mora Bellman, MD 02/19/2016 2:12 PM

## 2016-02-21 ENCOUNTER — Encounter: Payer: Self-pay | Admitting: Obstetrics and Gynecology

## 2016-02-24 ENCOUNTER — Telehealth: Payer: Self-pay | Admitting: *Deleted

## 2016-02-24 NOTE — Telephone Encounter (Signed)
Attempted to call patient in regards to a MyChart message. Pt diagnosed with miscarriage vs ectopic pregnancy 02/17/16 in MAU. Returned to clinic 02/19/16. On 02/21/16 message via MyChart stated she is still bleeding, cramping, passing clots. Also having pain in her side. Patient did not answer the phone, message left stating I am calling to check on her symptoms, she should please return my call at the clinics when open tomorrow.

## 2016-02-26 ENCOUNTER — Other Ambulatory Visit: Payer: Self-pay

## 2016-02-27 NOTE — Telephone Encounter (Signed)
Pt missed appointment for repeat hcg level on 3/15. Called patient with no answer, unable to leave voicemail. Mychart message to patient.

## 2016-03-03 NOTE — Telephone Encounter (Signed)
Left a detailed message concerning the importance of patient needing to have a repeat beta. I have advised patient to call back and scheduled another appt.

## 2016-11-25 ENCOUNTER — Encounter: Payer: Self-pay | Admitting: Obstetrics and Gynecology

## 2016-12-22 ENCOUNTER — Encounter (HOSPITAL_COMMUNITY): Payer: Self-pay

## 2017-02-11 DIAGNOSIS — Z9221 Personal history of antineoplastic chemotherapy: Secondary | ICD-10-CM

## 2017-03-03 ENCOUNTER — Encounter (HOSPITAL_COMMUNITY): Payer: Self-pay | Admitting: *Deleted

## 2017-03-03 ENCOUNTER — Inpatient Hospital Stay (HOSPITAL_COMMUNITY): Payer: Self-pay

## 2017-03-03 ENCOUNTER — Inpatient Hospital Stay (HOSPITAL_COMMUNITY)
Admission: AD | Admit: 2017-03-03 | Discharge: 2017-03-03 | Disposition: A | Discharge status: Home / Self Care | Payer: Self-pay | Source: Ambulatory Visit | Attending: Obstetrics and Gynecology | Admitting: Obstetrics and Gynecology

## 2017-03-03 DIAGNOSIS — N760 Acute vaginitis: Secondary | ICD-10-CM

## 2017-03-03 DIAGNOSIS — O3680X0 Pregnancy with inconclusive fetal viability, not applicable or unspecified: Secondary | ICD-10-CM

## 2017-03-03 DIAGNOSIS — O9989 Other specified diseases and conditions complicating pregnancy, childbirth and the puerperium: Secondary | ICD-10-CM

## 2017-03-03 DIAGNOSIS — B9689 Other specified bacterial agents as the cause of diseases classified elsewhere: Secondary | ICD-10-CM | POA: Insufficient documentation

## 2017-03-03 DIAGNOSIS — O26899 Other specified pregnancy related conditions, unspecified trimester: Secondary | ICD-10-CM

## 2017-03-03 DIAGNOSIS — O23591 Infection of other part of genital tract in pregnancy, first trimester: Secondary | ICD-10-CM | POA: Insufficient documentation

## 2017-03-03 DIAGNOSIS — R109 Unspecified abdominal pain: Secondary | ICD-10-CM

## 2017-03-03 DIAGNOSIS — Z3A01 Less than 8 weeks gestation of pregnancy: Secondary | ICD-10-CM | POA: Insufficient documentation

## 2017-03-03 LAB — CBC
HCT: 39.8 % (ref 36.0–46.0)
Hemoglobin: 12.9 g/dL (ref 12.0–15.0)
MCH: 27.2 pg (ref 26.0–34.0)
MCHC: 32.4 g/dL (ref 30.0–36.0)
MCV: 84 fL (ref 78.0–100.0)
PLATELETS: 250 10*3/uL (ref 150–400)
RBC: 4.74 MIL/uL (ref 3.87–5.11)
RDW: 13.4 % (ref 11.5–15.5)
WBC: 7.7 10*3/uL (ref 4.0–10.5)

## 2017-03-03 LAB — URINALYSIS, ROUTINE W REFLEX MICROSCOPIC
Bacteria, UA: NONE SEEN
Bilirubin Urine: NEGATIVE
GLUCOSE, UA: 50 mg/dL — AB
HGB URINE DIPSTICK: NEGATIVE
KETONES UR: NEGATIVE mg/dL
Nitrite: NEGATIVE
PROTEIN: NEGATIVE mg/dL
Specific Gravity, Urine: 1.027 (ref 1.005–1.030)
pH: 5 (ref 5.0–8.0)

## 2017-03-03 LAB — WET PREP, GENITAL
Sperm: NONE SEEN
Trich, Wet Prep: NONE SEEN
Yeast Wet Prep HPF POC: NONE SEEN

## 2017-03-03 LAB — POCT PREGNANCY, URINE: Preg Test, Ur: POSITIVE — AB

## 2017-03-03 LAB — HCG, QUANTITATIVE, PREGNANCY: hCG, Beta Chain, Quant, S: 212 m[IU]/mL — ABNORMAL HIGH (ref <5)

## 2017-03-03 MED ORDER — METRONIDAZOLE 500 MG PO TABS: 500.0000 mg | ORAL_TABLET | Freq: Two times a day (BID) | ORAL | 0 refills | Status: AC

## 2017-03-03 NOTE — MAU Note (Signed)
Pt had pos HPT 2 days ago, has mild intermittent cramping for the past week.  Had a miscarriage a year ago, is concerned.  Denies bleeding.

## 2017-03-03 NOTE — Discharge Instructions (Signed)
Abdominal Pain During Pregnancy °Belly (abdominal) pain is common during pregnancy. Most of the time, it is not a serious problem. Other times, it can be a sign that something is wrong with the pregnancy. Always tell your doctor if you have belly pain. °Follow these instructions at home: °Monitor your belly pain for any changes. The following actions may help you feel better: °· Do not have sex (intercourse) or put anything in your vagina until you feel better. °· Rest until your pain stops. °· Drink clear fluids if you feel sick to your stomach (nauseous). Do not eat solid food until you feel better. °· Only take medicine as told by your doctor. °· Keep all doctor visits as told. °Get help right away if: °· You are bleeding, leaking fluid, or pieces of tissue come out of your vagina. °· You have more pain or cramping. °· You keep throwing up (vomiting). °· You have pain when you pee (urinate) or have blood in your pee. °· You have a fever. °· You do not feel your baby moving as much. °· You feel very weak or feel like passing out. °· You have trouble breathing, with or without belly pain. °· You have a very bad headache and belly pain. °· You have fluid leaking from your vagina and belly pain. °· You keep having watery poop (diarrhea). °· Your belly pain does not go away after resting, or the pain gets worse. °This information is not intended to replace advice given to you by your health care provider. Make sure you discuss any questions you have with your health care provider. °Document Released: 11/18/2009 Document Revised: 07/08/2016 Document Reviewed: 06/29/2013 °Elsevier Interactive Patient Education © 2017 [employer] ° °

## 2017-03-03 NOTE — MAU Provider Note (Signed)
S: Brianna Jimenez is a 31 y.o [phone] at [redacted]w[redacted]d (LMP 01/25/17) who presents with crampy abdominal pain and white vaginal discharge.  Abdominal pain intermittent, sometimes after meals, patient describes as similar to her usual gassy state.  Patient also notes having white vaginal discharge for 1 week that is itchy, no odor, not painful.  No dysuria, hematuria, vaginal bleeding, constipation, diarrhea, headaches, nausea, vomiting, dizziness, fever, chills, SOB, chest pain.  Patient notes concern with this pregnancy, as she had a miscarriage one year ago.  O: Vitals - BP 115/60  HR 66  RR 16  T 98.9  Physical Exam  Constitutional: She is oriented to person, place, and time. She appears well-developed and well-nourished. No distress.  HENT:  Head: Normocephalic and atraumatic.  Cardiovascular: Normal rate, regular rhythm and normal heart sounds.  Exam reveals no gallop and no friction rub.   No murmur heard. Pulmonary/Chest: Effort normal and breath sounds normal. No respiratory distress. She has no wheezes. She has no rales.  Abdominal: Soft. Bowel sounds are normal. She exhibits no distension. There is no tenderness. There is no rebound and no guarding.  Genitourinary: There is no rash, tenderness or lesion on the right labia. There is no rash, tenderness or lesion on the left labia. Cervix exhibits no motion tenderness. No erythema, tenderness or bleeding in the vagina. No signs of injury around the vagina. Vaginal discharge (white milky discharge, some pooling) found.  Genitourinary Comments: Cervix closed. No blood in the vaginal vault.  Musculoskeletal: She exhibits no edema.  Neurological: She is alert and oriented to person, place, and time.  Skin: Skin is warm and dry.  Psychiatric: She has a normal mood and affect. Her behavior is normal.   Labs and imaging: Blood type: A positive Results for orders placed or performed during the hospital encounter of 03/03/17 (from the past 24 hour(s))   Urinalysis, Routine w reflex microscopic     Status: Abnormal   Collection Time: 03/03/17  6:15 PM  Result Value Ref Range   Color, Urine YELLOW YELLOW   APPearance HAZY (A) CLEAR   Specific Gravity, Urine 1.027 1.005 - 1.030   pH 5.0 5.0 - 8.0   Glucose, UA 50 (A) NEGATIVE mg/dL   Hgb urine dipstick NEGATIVE NEGATIVE   Bilirubin Urine NEGATIVE NEGATIVE   Ketones, ur NEGATIVE NEGATIVE mg/dL   Protein, ur NEGATIVE NEGATIVE mg/dL   Nitrite NEGATIVE NEGATIVE   Leukocytes, UA TRACE (A) NEGATIVE   RBC / HPF 0-5 0 - 5 RBC/hpf   WBC, UA 0-5 0 - 5 WBC/hpf   Bacteria, UA NONE SEEN NONE SEEN   Squamous Epithelial / LPF 6-30 (A) NONE SEEN   Mucous PRESENT   Pregnancy, urine POC     Status: Abnormal   Collection Time: 03/03/17  6:20 PM  Result Value Ref Range   Preg Test, Ur POSITIVE (A) NEGATIVE  CBC     Status: None   Collection Time: 03/03/17  7:06 PM  Result Value Ref Range   WBC 7.7 4.0 - 10.5 K/uL   RBC 4.74 3.87 - 5.11 MIL/uL   Hemoglobin 12.9 12.0 - 15.0 g/dL   HCT 39.8 36.0 - 46.0 %   MCV 84.0 78.0 - 100.0 fL   MCH 27.2 26.0 - 34.0 pg   MCHC 32.4 30.0 - 36.0 g/dL   RDW 13.4 11.5 - 15.5 %   Platelets 250 150 - 400 K/uL  Wet prep, genital     Status: Abnormal  Collection Time: 03/03/17  7:06 PM  Result Value Ref Range   Yeast Wet Prep HPF POC NONE SEEN NONE SEEN   Trich, Wet Prep NONE SEEN NONE SEEN   Clue Cells Wet Prep HPF POC PRESENT (A) NONE SEEN   WBC, Wet Prep HPF POC MANY (A) NONE SEEN   Sperm NONE SEEN    Ultrasound: pending  A: Brianna Jimenez is a 31 y.o G75P1021 female at [redacted]w[redacted]d by LMP who presents with nontender intermittent abdominal cramping, white vaginal discharge, positive clue cells, and concern about repeat miscarriage.   P: 1. Pregnancy of unknown location: counsel about when to return to MAU with future concerns. Beta Hcg in 48 hours 2. White vaginal discharge, positive clue cells: treat BV with flagyl BID 7 days.   Brianna Jimenez, MS3  I confirm  that I have verified the information documented in the students's note and that I have also personally performed the physical exam and all medical decision making activities.  Brianna Doe, MD 03/03/17 2027

## 2017-03-04 LAB — GC/CHLAMYDIA PROBE AMP (~~LOC~~) NOT AT ARMC
Chlamydia: NEGATIVE
NEISSERIA GONORRHEA: NEGATIVE

## 2017-03-05 ENCOUNTER — Ambulatory Visit: Payer: Self-pay

## 2017-03-05 DIAGNOSIS — O3680X0 Pregnancy with inconclusive fetal viability, not applicable or unspecified: Secondary | ICD-10-CM

## 2017-03-05 LAB — HCG, QUANTITATIVE, PREGNANCY: hCG, Beta Chain, Quant, S: 391 m[IU]/mL — ABNORMAL HIGH (ref <5)

## 2017-03-05 NOTE — Progress Notes (Signed)
Patient presented to office today for stat beta hcg. Patient reports some lower abdominal pain but no bleeding at this time. Patient was ask to wait in our office for her results. Patient voice understanding at this time.

## 2017-03-06 ENCOUNTER — Encounter (HOSPITAL_COMMUNITY): Payer: Self-pay | Admitting: *Deleted

## 2017-03-06 ENCOUNTER — Inpatient Hospital Stay (HOSPITAL_COMMUNITY)
Admission: AD | Admit: 2017-03-06 | Discharge: 2017-03-06 | Disposition: A | Discharge status: Home / Self Care | Payer: Self-pay | Source: Ambulatory Visit | Attending: Obstetrics & Gynecology | Admitting: Obstetrics & Gynecology

## 2017-03-06 ENCOUNTER — Inpatient Hospital Stay (HOSPITAL_COMMUNITY): Payer: Self-pay

## 2017-03-06 DIAGNOSIS — F17210 Nicotine dependence, cigarettes, uncomplicated: Secondary | ICD-10-CM | POA: Insufficient documentation

## 2017-03-06 DIAGNOSIS — N949 Unspecified condition associated with female genital organs and menstrual cycle: Secondary | ICD-10-CM

## 2017-03-06 DIAGNOSIS — R109 Unspecified abdominal pain: Secondary | ICD-10-CM | POA: Insufficient documentation

## 2017-03-06 DIAGNOSIS — N9489 Other specified conditions associated with female genital organs and menstrual cycle: Secondary | ICD-10-CM

## 2017-03-06 DIAGNOSIS — Z3A01 Less than 8 weeks gestation of pregnancy: Secondary | ICD-10-CM | POA: Insufficient documentation

## 2017-03-06 DIAGNOSIS — O209 Hemorrhage in early pregnancy, unspecified: Secondary | ICD-10-CM | POA: Insufficient documentation

## 2017-03-06 DIAGNOSIS — O99331 Smoking (tobacco) complicating pregnancy, first trimester: Secondary | ICD-10-CM | POA: Insufficient documentation

## 2017-03-06 LAB — URINALYSIS, ROUTINE W REFLEX MICROSCOPIC
BILIRUBIN URINE: NEGATIVE
GLUCOSE, UA: 50 mg/dL — AB
KETONES UR: 5 mg/dL — AB
LEUKOCYTES UA: NEGATIVE
NITRITE: NEGATIVE
PROTEIN: NEGATIVE mg/dL
Specific Gravity, Urine: 1.023 (ref 1.005–1.030)
pH: 5 (ref 5.0–8.0)

## 2017-03-06 LAB — CBC
HCT: 42.2 % (ref 36.0–46.0)
Hemoglobin: 14 g/dL (ref 12.0–15.0)
MCH: 27.7 pg (ref 26.0–34.0)
MCHC: 33.2 g/dL (ref 30.0–36.0)
MCV: 83.6 fL (ref 78.0–100.0)
Platelets: 263 10*3/uL (ref 150–400)
RBC: 5.05 MIL/uL (ref 3.87–5.11)
RDW: 13.4 % (ref 11.5–15.5)
WBC: 8.8 10*3/uL (ref 4.0–10.5)

## 2017-03-06 LAB — COMPREHENSIVE METABOLIC PANEL
ALBUMIN: 4.5 g/dL (ref 3.5–5.0)
ALK PHOS: 31 U/L — AB (ref 38–126)
ALT: 14 U/L (ref 14–54)
ANION GAP: 7 (ref 5–15)
AST: 14 U/L — ABNORMAL LOW (ref 15–41)
BUN: 7 mg/dL (ref 6–20)
CALCIUM: 9.6 mg/dL (ref 8.9–10.3)
CHLORIDE: 105 mmol/L (ref 101–111)
CO2: 24 mmol/L (ref 22–32)
Creatinine, Ser: 0.77 mg/dL (ref 0.44–1.00)
GFR calc Af Amer: >60 mL/min (ref >60)
GFR calc non Af Amer: >60 mL/min (ref >60)
GLUCOSE: 95 mg/dL (ref 65–99)
Potassium: 3.7 mmol/L (ref 3.5–5.1)
SODIUM: 136 mmol/L (ref 135–145)
Total Bilirubin: 0.5 mg/dL (ref 0.3–1.2)
Total Protein: 7.8 g/dL (ref 6.5–8.1)

## 2017-03-06 LAB — HCG, QUANTITATIVE, PREGNANCY: HCG, BETA CHAIN, QUANT, S: 376 m[IU]/mL — AB (ref <5)

## 2017-03-06 MED ORDER — PROMETHAZINE HCL 25 MG/ML IJ SOLN: 25.0000 mg | Freq: Once | INTRAMUSCULAR | Status: DC

## 2017-03-06 MED ORDER — NALBUPHINE HCL 10 MG/ML IJ SOLN
10.0000 mg | Freq: Once | INTRAMUSCULAR | Status: AC
Start: 2017-03-06 — End: 2017-03-06
  Administered 2017-03-06: 10 mg via INTRAMUSCULAR
  Filled 2017-03-06: qty 1

## 2017-03-06 MED ORDER — PROMETHAZINE HCL 25 MG/ML IJ SOLN
12.5000 mg | Freq: Once | INTRAMUSCULAR | Status: AC
  Administered 2017-03-06: 12.5 mg via INTRAMUSCULAR
  Filled 2017-03-06: qty 1

## 2017-03-06 MED ORDER — METHOTREXATE INJECTION FOR WOMEN'S HOSPITAL
50.0000 mg/m2 | Freq: Once | INTRAMUSCULAR | Status: AC
  Administered 2017-03-06: 85 mg via INTRAMUSCULAR
  Filled 2017-03-06: qty 1.7

## 2017-03-06 MED ORDER — NALBUPHINE HCL 10 MG/ML IJ SOLN: 10.0000 mg | Freq: Once | INTRAMUSCULAR | Status: DC

## 2017-03-06 NOTE — Discharge Instructions (Signed)
Methotrexate Treatment for an Ectopic Pregnancy, Care After °Refer to this sheet in the next few weeks. These instructions provide you with information on caring for yourself after your procedure. Your health care provider may also give you more specific instructions. Your treatment has been planned according to current medical practices, but problems sometimes occur. Call your health care provider if you have any problems or questions after your procedure. °What can I expect after the procedure? °You may have some abdominal cramping, vaginal bleeding, and fatigue in the first few days after taking methotrexate. Some other possible side effects of methotrexate include: °· Nausea. °· Vomiting. °· Diarrhea. °· Mouth sores. °· Swelling or irritation of the lining of your lungs (pneumonitis). °· Liver damage. °· Hair loss. ° °Follow these instructions at home: °After you have received the methotrexate medicine, you need to be careful of your activities and watch your condition for several weeks. It may take 1 week before your hormone levels return to normal. °Activity °· Do not have sexual intercourse until your health care provider says it is safe to do so. °· You may resume your usual diet. °· Limit strenuous activity. °· Do not drink alcohol. °General instructions °· Do not take aspirin, ibuprofen, or naproxen (nonsteroidal anti-inflammatory drugs [NSAIDs]). °· Do not take folic acid, prenatal vitamins, or other vitamins that contain folic acid. °· Avoid traveling too far away from your health care provider. °· Keep all follow-up visits as told by your health care provider. This is important. °Contact a health care provider if: °· You cannot control your nausea and vomiting. °· You cannot control your diarrhea. °· You have sores in your mouth and want treatment. °· You need pain medicine for your abdominal pain. °· You have a rash. °· You are having a reaction to the medicine. °Get help right away if: °· You have  increasing abdominal or pelvic pain. °· You notice increased bleeding. °· You feel light-headed, or you faint. °· You have shortness of breath. °· Your heart rate increases. °· You have a cough. °· You have chills. °· You have a fever. °This information is not intended to replace advice given to you by your health care provider. Make sure you discuss any questions you have with your health care provider. °Document Released: 11/19/2011 Document Revised: 05/07/2016 Document Reviewed: 09/18/2013 °Elsevier Interactive Patient Education © 2017 Else[employer] Inc[employer] °Ectopic Pregnancy °An ectopic pregnancy happens when a fertilized egg grows outside the uterus. A pregnancy cannot live outside of the uterus. This problem often happens in the fallopian tube. It is often caused by damage to the fallopian tube. °If this problem is found early, you may be treated with medicine. If your tube tears or bursts open (ruptures), you will bleed inside. This is an emergency. You will need surgery. Get help right away. °What are the signs or symptoms? °You may have normal pregnancy symptoms at first. These include: °· Missing your period. °· Feeling sick to your stomach (nauseous). °· Being tired. °· Having tender breasts. ° °Then, you may start to have symptoms that are not normal. These include: °· Pain with sex (intercourse). °· Bleeding from the vagina. This includes light bleeding (spotting). °· Belly (abdomen) or lower belly cramping or pain. This may be felt on one side. °· A fast heartbeat (pulse). °· Passing out (fainting) after going poop (bowel movement). ° °If your tube tears, you may have symptoms such as: °· Really bad pain in the belly or lower belly. This   happens suddenly. °· Dizziness. °· Passing out. °· Shoulder pain. ° °Get help right away if: °You have any of these symptoms. This is an emergency. °This information is not intended to replace advice given to you by your health care provider. Make sure you discuss any  questions you have with your health care provider. °Document Released: 02/26/2009 Document Revised: 05/07/2016 Document Reviewed: 07/12/2013 °Elsevier Interactive Patient Education © 2017 Elsevier Inc. ° °

## 2017-03-06 NOTE — MAU Note (Signed)
Pt had her quant drawn yesterday at the clinic. Was having very mild pain yesterday. Last night pt reports that the pain intensified. Now having light bleeding and persistent pain.

## 2017-03-06 NOTE — MAU Provider Note (Signed)
History     CSN: [id number]  Arrival date and time: 03/06/17 1112   None     Chief Complaint  Patient presents with  . Abdominal Pain  . Vaginal Bleeding   HPI   Ms.Brianna Jimenez is a 31 y.o. female [phone] @ [id number] in MAU with abdominal pain and vaginal bleeding. She was seen on 3/21 and again yesterday (3/23) for Quants. She was told that her quants were rising appropriately. Yesterday after she left the clinic she started spotting, and last night her pain intensified. The pain is located in the RLQ, the pain is now constant. She rates her pain 7/10. She has not taken anything for the pain. The pain is what made her return today to the MAU.  She is requesting pain medication.   OB History    Gravida Para Term Preterm AB Living   _0 0 2 1   SAB TAB Ectopic Multiple Live Births   1 1 0 0 1      Past Medical History:  Diagnosis Date  . Chlamydia   . No pertinent past medical history   . Trichomonas     Past Surgical History:  Procedure Laterality Date  . NO PAST SURGERIES      History reviewed. No pertinent family history.  Social History  Substance Use Topics  . Smoking status: Current Every Day Smoker    Packs/day: 0.50    Types: Cigarettes  . Smokeless tobacco: Current User  . Alcohol use Yes     Comment: occasional    Allergies: No Known Allergies  Prescriptions Prior to Admission  Medication Sig Dispense Refill Last Dose  . Prenatal MV-Min-FA-Omega-3 (PRENATAL GUMMIES/DHA & FA) 0.4-32.5 MG CHEW Chew 2 each by mouth daily.   03/06/2017 at Unknown time  . metroNIDAZOLE (FLAGYL) 500 MG tablet Take 1 tablet (500 mg total) by mouth 2 (two) times daily. 14 tablet 0 Not Taking   Results for orders placed or performed during the hospital encounter of 03/06/17 (from the past 48 hour(s))  Urinalysis, Routine w reflex microscopic     Status: Abnormal   Collection Time: 03/06/17 11:23 AM  Result Value Ref Range   Color, Urine YELLOW YELLOW   APPearance  HAZY (A) CLEAR   Specific Gravity, Urine 1.023 1.005 - 1.030   pH 5.0 5.0 - 8.0   Glucose, UA 50 (A) NEGATIVE mg/dL   Hgb urine dipstick MODERATE (A) NEGATIVE   Bilirubin Urine NEGATIVE NEGATIVE   Ketones, ur 5 (A) NEGATIVE mg/dL   Protein, ur NEGATIVE NEGATIVE mg/dL   Nitrite NEGATIVE NEGATIVE   Leukocytes, UA NEGATIVE NEGATIVE   RBC / HPF 0-5 0 - 5 RBC/hpf   WBC, UA 0-5 0 - 5 WBC/hpf   Bacteria, UA RARE (A) NONE SEEN   Squamous Epithelial / LPF 6-30 (A) NONE SEEN   Mucous PRESENT   hCG, quantitative, pregnancy     Status: Abnormal   Collection Time: 03/06/17 12:02 PM  Result Value Ref Range   hCG, Beta Chain, Quant, S 376 (H) <5 mIU/mL    Comment:          GEST. AGE      CONC.  (mIU/mL)   <=1 WEEK        5 - 50     2 WEEKS       50 - 500     3 WEEKS       100 - 10,000  4 WEEKS     1,000 - 30,000     5 WEEKS     3,500 - 115,000   6-8 WEEKS     12,000 - 270,000    12 WEEKS     15,000 - 220,000        FEMALE AND NON-PREGNANT FEMALE:     LESS THAN 5 mIU/mL   CBC     Status: None   Collection Time: 03/06/17 12:02 PM  Result Value Ref Range   WBC 8.8 4.0 - 10.5 K/uL   RBC 5.05 3.87 - 5.11 MIL/uL   Hemoglobin 14.0 12.0 - 15.0 g/dL   HCT 42.2 36.0 - 46.0 %   MCV 83.6 78.0 - 100.0 fL   MCH 27.7 26.0 - 34.0 pg   MCHC 33.2 30.0 - 36.0 g/dL   RDW 13.4 11.5 - 15.5 %   Platelets 263 150 - 400 K/uL  Comprehensive metabolic panel     Status: Abnormal   Collection Time: 03/06/17 12:07 PM  Result Value Ref Range   Sodium 136 135 - 145 mmol/L   Potassium 3.7 3.5 - 5.1 mmol/L   Chloride 105 101 - 111 mmol/L   CO2 24 22 - 32 mmol/L   Glucose, Bld 95 65 - 99 mg/dL   BUN 7 6 - 20 mg/dL   Creatinine, Ser 0.77 0.44 - 1.00 mg/dL   Calcium 9.6 8.9 - 10.3 mg/dL   Total Protein 7.8 6.5 - 8.1 g/dL   Albumin 4.5 3.5 - 5.0 g/dL   AST 14 (L) 15 - 41 U/L   ALT 14 14 - 54 U/L   Alkaline Phosphatase 31 (L) 38 - 126 U/L   Total Bilirubin 0.5 0.3 - 1.2 mg/dL   GFR calc non Af Amer >60 >60  mL/min   GFR calc Af Amer >60 >60 mL/min    Comment: (NOTE) The eGFR has been calculated using the CKD EPI equation. This calculation has not been validated in all clinical situations. eGFR's persistently <60 mL/min signify possible Chronic Kidney Disease.    Anion gap 7 5 - 15   US Ob Transvaginal  Result Date: 03/06/2017 CLINICAL DATA:  Pregnant patient with right lower quadrant abdominal pain. EXAM: TRANSVAGINAL OB ULTRASOUND TECHNIQUE: Transvaginal ultrasound was performed for complete evaluation of the gestation as well as the maternal uterus, adnexal regions, and pelvic cul-de-sac. COMPARISON:  Pelvic ultrasound 03/03/2017 FINDINGS: Intrauterine gestational sac: None Yolk sac:  Not Visualized. Embryo:  Not Visualized. Cardiac Activity: Not Visualized. Maternal uterus/adnexae: Within the right adnexa, separate from the right ovary, is a 2.5 x 1.2 x 1.4 cm soft tissue mass. The right and left ovaries are unremarkable in appearance. Small amount of free fluid in the pelvis. IMPRESSION: Soft tissue mass within the right adnexa appears separate from the right ovary, raising the possibility of ectopic pregnancy in the setting of positive pregnancy test without intrauterine gestation. Critical Value/emergent results were called by telephone at the time of interpretation on 03/06/2017 at 12:54 pm to Dr. Anderson [facility] , who verbally acknowledged these results. Electronically Signed   By: Lovey Newcomer M.D.   On: 03/06/2017 12:57    Review of Systems  Constitutional: Negative for fever.  Gastrointestinal: Positive for abdominal pain. Negative for nausea and vomiting.  Genitourinary: Positive for vaginal bleeding.   Physical Exam   Blood pressure 119/64, pulse 65, temperature 98.5 F (36.9 C), temperature source Oral, resp. rate 16, last menstrual period 01/25/2017, unknown if currently breastfeeding.  Physical  Exam  Constitutional: She is oriented to person, place, and time. She appears  well-developed and well-nourished. No distress.  HENT:  Head: Normocephalic.  Eyes: Pupils are equal, round, and reactive to light.  GI: There is tenderness in the right lower quadrant and suprapubic area. There is no rigidity, no rebound and no guarding.  Musculoskeletal: Normal range of motion.  Neurological: She is alert and oriented to person, place, and time.  Skin: Skin is warm. She is not diaphoretic.  Psychiatric: Her behavior is normal.    MAU Course  Procedures  None  MDM  A positive blood type  Quant 3/21: 212 Quant 3/23: 391 Quant 3/24: 376 Inappropriate rise in beta hcg level, + abdominal pain, and vaginal bleeding, and [place] that shows a soft tissue mass within the right adnexa appears separate from the right ovary concerning for right ectopic pregnancy.  Discussed labs, HPI and [place] with Dr. Gala Romney.  Patient offered MTX for right ectopic pregnancy. Patient emotional, however agreeable to plan of care.  CMP stable  MTX given by RN.   Assessment and Plan   A:  1. Adnexal mass   2. Vaginal bleeding in pregnancy, first trimester     P:  Discharge home in stable condition  Discontinue folic acid supplements including prenatal vitamins  Counseled patient to avoid NSAIDs, recommend acetaminophen if an analgesic is needed  Advised patient to refrain from sexual intercourse and strenuous exercise  Patient to present to the [facility] on Tuesday 3/27 for Day #4 Hcg level.  Return to MAU if symptoms worsen.   Lezlie Lye, NP 03/06/2017 4:33 PM

## 2017-03-09 ENCOUNTER — Ambulatory Visit: Payer: Medicaid Other

## 2017-03-09 ENCOUNTER — Telehealth: Payer: Self-pay | Admitting: *Deleted

## 2017-03-09 NOTE — Telephone Encounter (Signed)
Called pt and left message stating that she missed an appointment in our office today. The purpose if this appt ([facility]) is very important to her on-going health and well-being. Please call back to reschedule the appt for tomorrow or Thursday. It is important that we see her this week. If she has questions, she may leave a message on the nurse voicemail.

## 2017-06-23 ENCOUNTER — Inpatient Hospital Stay (HOSPITAL_COMMUNITY)
Admission: AD | Admit: 2017-06-23 | Discharge: 2017-06-23 | Disposition: A | Discharge status: Home / Self Care | Payer: Medicaid Other | Source: Ambulatory Visit | Attending: Obstetrics and Gynecology | Admitting: Obstetrics and Gynecology

## 2017-06-23 ENCOUNTER — Encounter (HOSPITAL_COMMUNITY): Payer: Self-pay | Admitting: *Deleted

## 2017-06-23 DIAGNOSIS — N3000 Acute cystitis without hematuria: Secondary | ICD-10-CM

## 2017-06-23 DIAGNOSIS — Z3202 Encounter for pregnancy test, result negative: Secondary | ICD-10-CM

## 2017-06-23 DIAGNOSIS — B9689 Other specified bacterial agents as the cause of diseases classified elsewhere: Secondary | ICD-10-CM

## 2017-06-23 DIAGNOSIS — F17210 Nicotine dependence, cigarettes, uncomplicated: Secondary | ICD-10-CM | POA: Insufficient documentation

## 2017-06-23 DIAGNOSIS — Z113 Encounter for screening for infections with a predominantly sexual mode of transmission: Secondary | ICD-10-CM

## 2017-06-23 DIAGNOSIS — N760 Acute vaginitis: Secondary | ICD-10-CM | POA: Insufficient documentation

## 2017-06-23 HISTORY — DX: Cholecystitis, unspecified: K81.9

## 2017-06-23 LAB — URINALYSIS, ROUTINE W REFLEX MICROSCOPIC
Bilirubin Urine: NEGATIVE
Glucose, UA: NEGATIVE mg/dL
HGB URINE DIPSTICK: NEGATIVE
Ketones, ur: NEGATIVE mg/dL
NITRITE: POSITIVE — AB
PH: 6 (ref 5.0–8.0)
PROTEIN: NEGATIVE mg/dL
Specific Gravity, Urine: 1.016 (ref 1.005–1.030)

## 2017-06-23 LAB — WET PREP, GENITAL
SPERM: NONE SEEN
TRICH WET PREP: NONE SEEN
Yeast Wet Prep HPF POC: NONE SEEN

## 2017-06-23 LAB — POCT PREGNANCY, URINE: Preg Test, Ur: NEGATIVE

## 2017-06-23 MED ORDER — SULFAMETHOXAZOLE-TRIMETHOPRIM 800-160 MG PO TABS: 1.0000 | ORAL_TABLET | Freq: Two times a day (BID) | ORAL | 0 refills | Status: DC

## 2017-06-23 MED ORDER — METRONIDAZOLE 500 MG PO TABS: 500.0000 mg | ORAL_TABLET | Freq: Two times a day (BID) | ORAL | 0 refills | Status: DC

## 2017-06-23 NOTE — MAU Note (Addendum)
Pt has missed her period, LMP June 2, is having lower abd pain - intermittent.  Is worried about ectopic - hx of one in March, also a miscarriage.  Also concerned about possible STD.  Denies bleeding today.  Neg HPT 3 days ago.

## 2017-06-23 NOTE — MAU Note (Signed)
C/o intermittent abdominal pain for past 4 days; her period is late by 9 days; wants a STD check

## 2017-06-23 NOTE — MAU Provider Note (Signed)
History     CSN: [id number]  Arrival date and time: 06/23/17 1633  First Provider Initiated Contact with Patient 06/23/17 1745      Chief Complaint  Patient presents with  . Abdominal Pain   HPI Brianna Jimenez is a 31 y.o. female who presents for abdominal cramping & missed menses. LMP was 05/15/17. Patient had negative pregnancy test at home. Patient is sexually active with 1 partner & does not use contraception. States she only misses her period when she's pregnant or has an STD so she would like STD testing today. Reports intermittent lower abdominal pain this week. Rates pain 4/10. Has not treated pain. Nothing makes better or worse. Denies vaginal bleeding, vaginal discharge, dyspareunia, postcoital bleeding, or fever. Endorses urinary frequency, urgency, & dysuria. Denies hematuria.   Past Medical History:  Diagnosis Date  . Chlamydia   . Cholecystitis, unspecified   . Trichomonas     Past Surgical History:  Procedure Laterality Date  . NO PAST SURGERIES      No family history on file.  Social History  Substance Use Topics  . Smoking status: Current Every Day Smoker    Packs/day: 0.50    Types: Cigarettes  . Smokeless tobacco: Current User  . Alcohol use Yes     Comment: occasional    Allergies: No Known Allergies  Prescriptions Prior to Admission  Medication Sig Dispense Refill Last Dose  . metroNIDAZOLE (FLAGYL) 500 MG tablet Take 500 mg by mouth once.   06/23/2017 at Unknown time    Review of Systems  Constitutional: Negative.   Gastrointestinal: Positive for abdominal pain. Negative for constipation, diarrhea, nausea and vomiting.  Genitourinary: Positive for dysuria, frequency and urgency. Negative for dyspareunia, flank pain, hematuria, vaginal bleeding and vaginal discharge.  Musculoskeletal: Negative for back pain.   Physical Exam   Blood pressure 126/69, pulse (!) 51, temperature 98.2 F (36.8 C), temperature source Oral, resp. rate 16, height 5'  5" (1.651 m), weight 128 lb (58.1 kg), last menstrual period 05/15/2017, unknown if currently breastfeeding.  Physical Exam  Nursing note and vitals reviewed. Constitutional: She is oriented to person, place, and time. She appears well-developed and well-nourished. No distress.  HENT:  Head: Normocephalic and atraumatic.  Eyes: Conjunctivae are normal. Right eye exhibits no discharge. Left eye exhibits no discharge. No scleral icterus.  Neck: Normal range of motion.  Respiratory: Effort normal. No respiratory distress.  GI: Soft. She exhibits no distension. There is no tenderness. There is no rebound, no guarding and no CVA tenderness.  Neurological: She is alert and oriented to person, place, and time.  Skin: Skin is warm and dry. She is not diaphoretic.  Psychiatric: She has a normal mood and affect. Her behavior is normal. Judgment and thought content normal.    MAU Course  Procedures Results for orders placed or performed during the hospital encounter of 06/23/17 (from the past 24 hour(s))  Urinalysis, Routine w reflex microscopic     Status: Abnormal   Collection Time: 06/23/17  5:02 PM  Result Value Ref Range   Color, Urine YELLOW YELLOW   APPearance CLEAR CLEAR   Specific Gravity, Urine 1.016 1.005 - 1.030   pH 6.0 5.0 - 8.0   Glucose, UA NEGATIVE NEGATIVE mg/dL   Hgb urine dipstick NEGATIVE NEGATIVE   Bilirubin Urine NEGATIVE NEGATIVE   Ketones, ur NEGATIVE NEGATIVE mg/dL   Protein, ur NEGATIVE NEGATIVE mg/dL   Nitrite POSITIVE (A) NEGATIVE   Leukocytes, UA SMALL (A) NEGATIVE  RBC / HPF 0-5 0 - 5 RBC/hpf   WBC, UA 6-30 0 - 5 WBC/hpf   Bacteria, UA MANY (A) NONE SEEN   Squamous Epithelial / LPF 0-5 (A) NONE SEEN   Mucous PRESENT   Pregnancy, urine POC     Status: None   Collection Time: 06/23/17  5:13 PM  Result Value Ref Range   Preg Test, Ur NEGATIVE NEGATIVE  Wet prep, genital     Status: Abnormal   Collection Time: 06/23/17  6:15 PM  Result Value Ref Range    Yeast Wet Prep HPF POC NONE SEEN NONE SEEN   Trich, Wet Prep NONE SEEN NONE SEEN   Clue Cells Wet Prep HPF POC PRESENT (A) NONE SEEN   WBC, Wet Prep HPF POC FEW (A) NONE SEEN   Sperm NONE SEEN     MDM UPT negative GC/CT & wet prep ordered Pt initially agreeable to HIV & RPR testing but refused blood draw when phlebotomist entered her room U/a + nitrites  Assessment and Plan  A: 1. Acute cystitis without hematuria   2. Pregnancy examination or test, negative result   3. BV (bacterial vaginosis)   4. Screen for STD (sexually transmitted disease)    P: Discharge home Rx bactrim & flagyl Discussed reasons to return to MAU Keep follow up appointment with OB/PCP  GC/CT pending   Jorje Guild 06/23/2017, 5:45 PM

## 2017-06-23 NOTE — Discharge Instructions (Signed)
Urinary Tract Infection, Adult A urinary tract infection (UTI) is an infection of any part of the urinary tract, which includes the kidneys, ureters, bladder, and urethra. These organs make, store, and get rid of urine in the body. UTI can be a bladder infection (cystitis) or kidney infection (pyelonephritis). What are the causes? This infection may be caused by fungi, viruses, or bacteria. Bacteria are the most common cause of UTIs. This condition can also be caused by repeated incomplete emptying of the bladder during urination. What increases the risk? This condition is more likely to develop if:  You ignore your need to urinate or hold urine for long periods of time.  You do not empty your bladder completely during urination.  You wipe back to front after urinating or having a bowel movement, if you are female.  You are uncircumcised, if you are female.  You are constipated.  You have a urinary catheter that stays in place (indwelling).  You have a weak defense (immune) system.  You have a medical condition that affects your bowels, kidneys, or bladder.  You have diabetes.  You take antibiotic medicines frequently or for long periods of time, and the antibiotics no longer work well against certain types of infections (antibiotic resistance).  You take medicines that irritate your urinary tract.  You are exposed to chemicals that irritate your urinary tract.  You are female.  What are the signs or symptoms? Symptoms of this condition include:  Fever.  Frequent urination or passing small amounts of urine frequently.  Needing to urinate urgently.  Pain or burning with urination.  Urine that smells bad or unusual.  Cloudy urine.  Pain in the lower abdomen or back.  Trouble urinating.  Blood in the urine.  Vomiting or being less hungry than normal.  Diarrhea or abdominal pain.  Vaginal discharge, if you are female.  How is this diagnosed? This condition is  diagnosed with a medical history and physical exam. You will also need to provide a urine sample to test your urine. Other tests may be done, including:  Blood tests.  Sexually transmitted disease (STD) testing.  If you have had more than one UTI, a cystoscopy or imaging studies may be done to determine the cause of the infections. How is this treated? Treatment for this condition often includes a combination of two or more of the following:  Antibiotic medicine.  Other medicines to treat less common causes of UTI.  Over-the-counter medicines to treat pain.  Drinking enough water to stay hydrated.  Follow these instructions at home:  Take over-the-counter and prescription medicines only as told by your health care provider.  If you were prescribed an antibiotic, take it as told by your health care provider. Do not stop taking the antibiotic even if you start to feel better.  Avoid alcohol, caffeine, tea, and carbonated beverages. They can irritate your bladder.  Drink enough fluid to keep your urine clear or pale yellow.  Keep all follow-up visits as told by your health care provider. This is important.  Make sure to: ? Empty your bladder often and completely. Do not hold urine for long periods of time. ? Empty your bladder before and after sex. ? Wipe from front to back after a bowel movement if you are female. Use each tissue one time when you wipe. Contact a health care provider if:  You have back pain.  You have a fever.  You feel nauseous or vomit.  Your symptoms do not  get better after 3 days.  Your symptoms go away and then return. Get help right away if:  You have severe back pain or lower abdominal pain.  You are vomiting and cannot keep down any medicines or water. This information is not intended to replace advice given to you by your health care provider. Make sure you discuss any questions you have with your health care provider. Document Released:  09/09/2005 Document Revised: 05/13/2016 Document Reviewed: 10/21/2015 Elsevier Interactive Patient Education  2017 Else[employer] Inc.   Bacterial Vaginosis Bacterial vaginosis is an infection of the vagina. It happens when too many germs (bacteria) grow in the vagina. This infection puts you at risk for infections from sex (STIs). Treating this infection can lower your risk for some STIs. You should also treat this if you are pregnant. It can cause your baby to be born early. Follow these instructions at home: Medicines  Take over-the-counter and prescription medicines only as told by your doctor.  Take or use your antibiotic medicine as told by your doctor. Do not stop taking or using it even if you start to feel better. General instructions  If you your sexual partner is a woman, tell her that you have this infection. She needs to get treatment if she has symptoms. If you have a female partner, he does not need to be treated.  During treatment: ? Avoid sex. ? Do not douche. ? Avoid alcohol as told. ? Avoid breastfeeding as told.  Drink enough fluid to keep your pee (urine) clear or pale yellow.  Keep your vagina and butt (rectum) clean. ? Wash the area with warm water every day. ? Wipe from front to back after you use the toilet.  Keep all follow-up visits as told by your doctor. This is important. Preventing this condition  Do not douche.  Use only warm water to wash around your vagina.  Use protection when you have sex. This includes: ? Latex condoms. ? Dental dams.  Limit how many people you have sex with. It is best to only have sex with the same person (be monogamous).  Get tested for STIs. Have your partner get tested.  Wear underwear that is cotton or lined with cotton.  Avoid tight pants and pantyhose. This is most important in summer.  Do not use any products that have nicotine or tobacco in them. These include cigarettes and e-cigarettes. If you need help  quitting, ask your doctor.  Do not use illegal drugs.  Limit how much alcohol you drink. Contact a doctor if:  Your symptoms do not get better, even after you are treated.  You have more discharge or pain when you pee (urinate).  You have a fever.  You have pain in your belly (abdomen).  You have pain with sex.  Your bleed from your vagina between periods. Summary  This infection happens when too many germs (bacteria) grow in the vagina.  Treating this condition can lower your risk for some infections from sex (STIs).  You should also treat this if you are pregnant. It can cause early (premature) birth.  Do not stop taking or using your antibiotic medicine even if you start to feel better. This information is not intended to replace advice given to you by your health care provider. Make sure you discuss any questions you have with your health care provider. Document Released: 09/08/2008 Document Revised: 08/15/2016 Document Reviewed: 08/15/2016 Elsevier Interactive Patient Education  2017 [name].

## 2017-06-23 NOTE — MAU Note (Signed)
UPT is negative today in MAU

## 2017-06-23 NOTE — MAU Note (Signed)
States that the only time that she misses her period is when is is pregnant or has an STD; anxious about having a STD:

## 2017-06-24 LAB — GC/CHLAMYDIA PROBE AMP (~~LOC~~) NOT AT ARMC
Chlamydia: NEGATIVE
Neisseria Gonorrhea: NEGATIVE

## 2017-10-06 ENCOUNTER — Inpatient Hospital Stay (HOSPITAL_COMMUNITY): Payer: Medicaid Other

## 2017-10-06 ENCOUNTER — Encounter (HOSPITAL_COMMUNITY): Payer: Self-pay | Admitting: *Deleted

## 2017-10-06 ENCOUNTER — Inpatient Hospital Stay (HOSPITAL_COMMUNITY)
Admission: AD | Admit: 2017-10-06 | Discharge: 2017-10-06 | Disposition: A | Discharge status: Home / Self Care | Payer: Medicaid Other | Source: Ambulatory Visit | Attending: Family Medicine | Admitting: Family Medicine

## 2017-10-06 DIAGNOSIS — R109 Unspecified abdominal pain: Secondary | ICD-10-CM

## 2017-10-06 DIAGNOSIS — Z3490 Encounter for supervision of normal pregnancy, unspecified, unspecified trimester: Secondary | ICD-10-CM

## 2017-10-06 DIAGNOSIS — R1032 Left lower quadrant pain: Secondary | ICD-10-CM | POA: Insufficient documentation

## 2017-10-06 DIAGNOSIS — Z3A08 8 weeks gestation of pregnancy: Secondary | ICD-10-CM | POA: Diagnosis not present

## 2017-10-06 DIAGNOSIS — Z3A01 Less than 8 weeks gestation of pregnancy: Secondary | ICD-10-CM | POA: Diagnosis not present

## 2017-10-06 DIAGNOSIS — O26891 Other specified pregnancy related conditions, first trimester: Secondary | ICD-10-CM | POA: Insufficient documentation

## 2017-10-06 DIAGNOSIS — F17210 Nicotine dependence, cigarettes, uncomplicated: Secondary | ICD-10-CM | POA: Diagnosis not present

## 2017-10-06 DIAGNOSIS — O99331 Smoking (tobacco) complicating pregnancy, first trimester: Secondary | ICD-10-CM | POA: Diagnosis not present

## 2017-10-06 DIAGNOSIS — O26899 Other specified pregnancy related conditions, unspecified trimester: Secondary | ICD-10-CM

## 2017-10-06 LAB — URINALYSIS, ROUTINE W REFLEX MICROSCOPIC
BILIRUBIN URINE: NEGATIVE
Glucose, UA: NEGATIVE mg/dL
Hgb urine dipstick: NEGATIVE
KETONES UR: NEGATIVE mg/dL
Leukocytes, UA: NEGATIVE
NITRITE: NEGATIVE
PH: 7 (ref 5.0–8.0)
PROTEIN: NEGATIVE mg/dL
Specific Gravity, Urine: 1.015 (ref 1.005–1.030)

## 2017-10-06 LAB — WET PREP, GENITAL
Sperm: NONE SEEN
TRICH WET PREP: NONE SEEN
Yeast Wet Prep HPF POC: NONE SEEN

## 2017-10-06 LAB — HCG, QUANTITATIVE, PREGNANCY: hCG, Beta Chain, Quant, S: 8650 m[IU]/mL — ABNORMAL HIGH (ref <5)

## 2017-10-06 LAB — CBC
HEMATOCRIT: 41.3 % (ref 36.0–46.0)
HEMOGLOBIN: 13.3 g/dL (ref 12.0–15.0)
MCH: 27.1 pg (ref 26.0–34.0)
MCHC: 32.2 g/dL (ref 30.0–36.0)
MCV: 84.1 fL (ref 78.0–100.0)
Platelets: 262 10*3/uL (ref 150–400)
RBC: 4.91 MIL/uL (ref 3.87–5.11)
RDW: 13.9 % (ref 11.5–15.5)
WBC: 8 10*3/uL (ref 4.0–10.5)

## 2017-10-06 LAB — POCT PREGNANCY, URINE: PREG TEST UR: POSITIVE — AB

## 2017-10-06 MED ORDER — ACETAMINOPHEN 500 MG PO TABS: 1000.0000 mg | ORAL_TABLET | Freq: Four times a day (QID) | ORAL | Status: DC | PRN

## 2017-10-06 NOTE — Discharge Instructions (Signed)

## 2017-10-06 NOTE — MAU Provider Note (Signed)
History     CSN: [id number]  Arrival date and time: 10/06/17 1100   First Provider Initiated Contact with Patient 10/06/17 1133      Chief Complaint  Patient presents with  . Vaginal Itching  . Abdominal Pain  . Possible Pregnancy   G5P1031 @[redacted]w[redacted]d  by sure LMP here with LLQ pain. Pain occurred last night, describes as throbbing. Happened 2-3 times and lasted around 30 seconds. She did not take anything for the pain. She denies VB and vaginal discharge. No fevers.    OB History    Gravida Para Term Preterm AB Living   5 1 1  0 3 1   SAB TAB Ectopic Multiple Live Births   1 1 1  0 1      Obstetric Comments   Hx of ectopic treated with MTX- 02/2017      Past Medical History:  Diagnosis Date  . Chlamydia   . Cholecystitis, unspecified   . Trichomonas     Past Surgical History:  Procedure Laterality Date  . NO PAST SURGERIES      No family history on file.  Social History  Substance Use Topics  . Smoking status: Current Every Day Smoker    Packs/day: 0.50    Types: Cigarettes  . Smokeless tobacco: Current User  . Alcohol use Yes     Comment: occasional    Allergies: No Known Allergies  No prescriptions prior to admission.    Review of Systems  Constitutional: Negative for chills and fever.  Gastrointestinal: Positive for abdominal pain and nausea. Negative for constipation, diarrhea and vomiting.  Genitourinary: Negative for vaginal bleeding and vaginal discharge.   Physical Exam   Blood pressure (!) 108/59, pulse 62, temperature 98.5 F (36.9 C), temperature source Oral, resp. rate 17, height 5\' 5"  (1.651 m), weight 128 lb (58.1 kg), last menstrual period 08/28/2017, SpO2 100 %, unknown if currently breastfeeding.  Physical Exam  Constitutional: She is oriented to person, place, and time. She appears well-developed and well-nourished. No distress.  HENT:  Head: Normocephalic and atraumatic.  Neck: Normal range of motion.  Cardiovascular: Normal rate.    Respiratory: Effort normal. No respiratory distress.  GI: Soft. She exhibits no distension and no mass. There is no tenderness. There is no rebound and no guarding.  Genitourinary:  Genitourinary Comments: External: no lesions or erythema Vagina: rugated, pink, moist, creamy white discharge Uterus: non enlarged, anteverted, non tender, no CMT Adnexae: no masses, no tenderness left, no tenderness right   Musculoskeletal: Normal range of motion.  Neurological: She is alert and oriented to person, place, and time.  Skin: Skin is warm and dry.  Psychiatric: She has a normal mood and affect.   Results for orders placed or performed during the hospital encounter of 10/06/17 (from the past 24 hour(s))  Urinalysis, Routine w reflex microscopic     Status: Abnormal   Collection Time: 10/06/17 11:10 AM  Result Value Ref Range   Color, Urine YELLOW YELLOW   APPearance HAZY (A) CLEAR   Specific Gravity, Urine 1.015 1.005 - 1.030   pH 7.0 5.0 - 8.0   Glucose, UA NEGATIVE NEGATIVE mg/dL   Hgb urine dipstick NEGATIVE NEGATIVE   Bilirubin Urine NEGATIVE NEGATIVE   Ketones, ur NEGATIVE NEGATIVE mg/dL   Protein, ur NEGATIVE NEGATIVE mg/dL   Nitrite NEGATIVE NEGATIVE   Leukocytes, UA NEGATIVE NEGATIVE  Pregnancy, urine POC     Status: Abnormal   Collection Time: 10/06/17 11:35 AM  Result Value Ref Range  Preg Test, Ur POSITIVE (A) NEGATIVE  Wet prep, genital     Status: Abnormal   Collection Time: 10/06/17 11:43 AM  Result Value Ref Range   Yeast Wet Prep HPF POC NONE SEEN NONE SEEN   Trich, Wet Prep NONE SEEN NONE SEEN   Clue Cells Wet Prep HPF POC PRESENT (A) NONE SEEN   WBC, Wet Prep HPF POC FEW (A) NONE SEEN   Sperm NONE SEEN   CBC     Status: None   Collection Time: 10/06/17 12:05 PM  Result Value Ref Range   WBC 8.0 4.0 - 10.5 K/uL   RBC 4.91 3.87 - 5.11 MIL/uL   Hemoglobin 13.3 12.0 - 15.0 g/dL   HCT 41.3 36.0 - 46.0 %   MCV 84.1 78.0 - 100.0 fL   MCH 27.1 26.0 - 34.0 pg    MCHC 32.2 30.0 - 36.0 g/dL   RDW 13.9 11.5 - 15.5 %   Platelets 262 150 - 400 K/uL  hCG, quantitative, pregnancy     Status: Abnormal   Collection Time: 10/06/17 12:05 PM  Result Value Ref Range   hCG, Beta Chain, Quant, S 8,650 (H) <5 mIU/mL   US Ob Comp Less 14 Wks  Result Date: 10/06/2017 CLINICAL DATA:  History of ectopic pregnancy. Positive pregnancy test, abdominal pain EXAM: OBSTETRIC <14 WK [place] AND TRANSVAGINAL OB US TECHNIQUE: Both transabdominal and transvaginal ultrasound examinations were performed for complete evaluation of the gestation as well as the maternal uterus, adnexal regions, and pelvic cul-de-sac. Transvaginal technique was performed to assess early pregnancy. COMPARISON:  03/06/2017 FINDINGS: Intrauterine gestational sac: Single Yolk sac:  Visualized Embryo:  Not visualized Cardiac Activity: Not visualized Heart Rate:   bpm MSD: 7.4  mm   5 w   3  d CRL:    mm    w    d                  [place] EDC: Subchorionic hemorrhage:  None visualized. Maternal uterus/adnexae: No adnexal masses or free fluid. 2.3 cm right fundal fibroid noted. IMPRESSION: Five week 3 day intrauterine pregnancy based on mean sac diameter. No yolk sac scratched at no fetal pole currently. This could be followed with repeat ultrasound in 14 days to ensure expected progression. Electronically Signed   By: Rolm Baptise M.D.   On: 10/06/2017 12:45   US Ob Transvaginal  Result Date: 10/06/2017 CLINICAL DATA:  History of ectopic pregnancy. Positive pregnancy test, abdominal pain EXAM: OBSTETRIC <14 WK [place] AND TRANSVAGINAL OB US TECHNIQUE: Both transabdominal and transvaginal ultrasound examinations were performed for complete evaluation of the gestation as well as the maternal uterus, adnexal regions, and pelvic cul-de-sac. Transvaginal technique was performed to assess early pregnancy. COMPARISON:  03/06/2017 FINDINGS: Intrauterine gestational sac: Single Yolk sac:  Visualized Embryo:  Not visualized Cardiac  Activity: Not visualized Heart Rate:   bpm MSD: 7.4  mm   5 w   3  d CRL:    mm    w    d                  [place] EDC: Subchorionic hemorrhage:  None visualized. Maternal uterus/adnexae: No adnexal masses or free fluid. 2.3 cm right fundal fibroid noted. IMPRESSION: Five week 3 day intrauterine pregnancy based on mean sac diameter. No yolk sac scratched at no fetal pole currently. This could be followed with repeat ultrasound in 14 days to ensure expected progression. Electronically Signed  By: Rolm Baptise M.D.   On: 10/06/2017 12:45    MAU Course  Procedures  MDM Labs and [place] ordered and reviewed. IUGS and YS seen on [place] indicating IUP. No evidence of UTI or infection. Pain likely physiologic to early pregnancy. Stable for discharge home.   Assessment and Plan   1. Early stage of pregnancy   2. Abdominal pain affecting pregnancy    Discharge home Follow up in OB office to start care in 4-5 wks- pt prefers [facility] SAB/return precautions Continue PNV daily  Allergies as of 10/06/2017   No Known Allergies     Medication List    STOP taking these medications   metroNIDAZOLE 500 MG tablet Commonly known as:  FLAGYL   sulfamethoxazole-trimethoprim 800-160 MG tablet Commonly known as:  BACTRIM DS,SEPTRA DS      Julianne Handler, CNM 10/06/2017, 1:21 PM

## 2017-10-06 NOTE — MAU Note (Signed)
Pt reports she had a positive home pregnancy test on Friday, reports she was having some lower left abd pain last pm. Pt reports she has a history of an ectopic and a miscarriage in the last year. Also reports vaginal itching and vaginal irritation.

## 2017-10-07 LAB — GC/CHLAMYDIA PROBE AMP (~~LOC~~) NOT AT ARMC
Chlamydia: NEGATIVE
Neisseria Gonorrhea: NEGATIVE

## 2017-10-25 ENCOUNTER — Encounter: Payer: Self-pay | Admitting: Certified Nurse Midwife

## 2017-10-28 ENCOUNTER — Other Ambulatory Visit: Payer: Self-pay | Admitting: Certified Nurse Midwife

## 2017-10-28 DIAGNOSIS — Z8759 Personal history of other complications of pregnancy, childbirth and the puerperium: Secondary | ICD-10-CM

## 2017-10-28 DIAGNOSIS — O2620 Pregnancy care for patient with recurrent pregnancy loss, unspecified trimester: Secondary | ICD-10-CM

## 2017-11-02 ENCOUNTER — Other Ambulatory Visit: Payer: Self-pay | Admitting: Certified Nurse Midwife

## 2017-11-02 ENCOUNTER — Ambulatory Visit (HOSPITAL_COMMUNITY)
Admission: RE | Admit: 2017-11-02 | Discharge: 2017-11-02 | Disposition: A | Discharge status: Home / Self Care | Payer: Medicaid Other | Source: Ambulatory Visit | Attending: Certified Nurse Midwife | Admitting: Certified Nurse Midwife

## 2017-11-02 DIAGNOSIS — O3411 Maternal care for benign tumor of corpus uteri, first trimester: Secondary | ICD-10-CM | POA: Insufficient documentation

## 2017-11-02 DIAGNOSIS — O2621 Pregnancy care for patient with recurrent pregnancy loss, first trimester: Secondary | ICD-10-CM | POA: Diagnosis present

## 2017-11-02 DIAGNOSIS — Z8759 Personal history of other complications of pregnancy, childbirth and the puerperium: Secondary | ICD-10-CM

## 2017-11-02 DIAGNOSIS — D259 Leiomyoma of uterus, unspecified: Secondary | ICD-10-CM | POA: Diagnosis not present

## 2017-11-02 DIAGNOSIS — Z3A09 9 weeks gestation of pregnancy: Secondary | ICD-10-CM | POA: Diagnosis not present

## 2017-11-02 DIAGNOSIS — O2620 Pregnancy care for patient with recurrent pregnancy loss, unspecified trimester: Secondary | ICD-10-CM

## 2017-11-11 ENCOUNTER — Other Ambulatory Visit: Payer: Self-pay | Admitting: Certified Nurse Midwife

## 2017-11-15 ENCOUNTER — Ambulatory Visit (INDEPENDENT_AMBULATORY_CARE_PROVIDER_SITE_OTHER): Payer: Medicaid Other | Admitting: Certified Nurse Midwife

## 2017-11-15 ENCOUNTER — Other Ambulatory Visit (HOSPITAL_COMMUNITY)
Admission: RE | Admit: 2017-11-15 | Discharge: 2017-11-15 | Disposition: A | Discharge status: Home / Self Care | Payer: Medicaid Other | Source: Ambulatory Visit | Attending: Certified Nurse Midwife | Admitting: Certified Nurse Midwife

## 2017-11-15 ENCOUNTER — Encounter: Payer: Self-pay | Admitting: Certified Nurse Midwife

## 2017-11-15 VITALS — BP 120/85 | HR 85 | Wt 130.8 lb

## 2017-11-15 DIAGNOSIS — Z3A00 Weeks of gestation of pregnancy not specified: Secondary | ICD-10-CM | POA: Diagnosis not present

## 2017-11-15 DIAGNOSIS — Z8759 Personal history of other complications of pregnancy, childbirth and the puerperium: Secondary | ICD-10-CM | POA: Insufficient documentation

## 2017-11-15 DIAGNOSIS — O219 Vomiting of pregnancy, unspecified: Secondary | ICD-10-CM

## 2017-11-15 DIAGNOSIS — O10919 Unspecified pre-existing hypertension complicating pregnancy, unspecified trimester: Secondary | ICD-10-CM

## 2017-11-15 DIAGNOSIS — O0990 Supervision of high risk pregnancy, unspecified, unspecified trimester: Secondary | ICD-10-CM

## 2017-11-15 DIAGNOSIS — O0991 Supervision of high risk pregnancy, unspecified, first trimester: Secondary | ICD-10-CM

## 2017-11-15 DIAGNOSIS — Z3480 Encounter for supervision of other normal pregnancy, unspecified trimester: Secondary | ICD-10-CM | POA: Insufficient documentation

## 2017-11-15 DIAGNOSIS — O10911 Unspecified pre-existing hypertension complicating pregnancy, first trimester: Secondary | ICD-10-CM

## 2017-11-15 MED ORDER — DOXYLAMINE-PYRIDOXINE 10-10 MG PO TBEC: DELAYED_RELEASE_TABLET | ORAL | 4 refills | Status: DC

## 2017-11-15 MED ORDER — PRENATE PIXIE 10-0.6-0.4-200 MG PO CAPS: 1.0000 | ORAL_CAPSULE | Freq: Every day | ORAL | 12 refills | Status: DC

## 2017-11-15 MED ORDER — ASPIRIN 81 MG PO CHEW: 81.0000 mg | CHEWABLE_TABLET | Freq: Every day | ORAL | 12 refills | Status: DC

## 2017-11-15 NOTE — Progress Notes (Signed)
Patient is in the office for initial ob visit, denies pain.

## 2017-11-15 NOTE — Progress Notes (Signed)
Subjective:   Brianna Jimenez is a 31 y.o. [id number] at [redacted]w[redacted]d by early ultrasound being seen today for her first obstetrical visit.  Her obstetrical history is significant for elevated blood pressure at start of exam, normotensive after exam ?white coat syndrome. Patient does intend to breast feed. Pregnancy history fully reviewed.  Patient reports nausea, no bleeding, no contractions, no cramping and no leaking.  HISTORY: Obstetric History   G5   P1   T1   P0   A3   L1    SAB1   TAB1   Ectopic1   Multiple0   Live Births1     # Outcome Date GA Lbr Len/2nd Weight Sex Delivery Anes PTL Lv  5 Current           4 Ectopic 02/2017             Complications: H/O methotrexate therapy  3 SAB 02/2016          2 Term 2009    F Vag-Spont   LIV  1 TAB             Obstetric Comments  Hx of ectopic treated with MTX- 02/2017   Past Medical History:  Diagnosis Date  . Chlamydia   . Cholecystitis, unspecified   . Trichomonas    Past Surgical History:  Procedure Laterality Date  . NO PAST SURGERIES     History reviewed. No pertinent family history. Social History   Tobacco Use  . Smoking status: Current Every Day Smoker    Packs/day: 0.50    Types: Cigarettes  . Smokeless tobacco: Current User  Substance Use Topics  . Alcohol use: Yes    Comment: occasional  . Drug use: Yes    Frequency: 1.0 times per week    Types: Marijuana   No Known Allergies Current Outpatient Medications on File Prior to Visit  Medication Sig Dispense Refill  . Prenatal Vit-Fe Fumarate-FA (MULTIVITAMIN-PRENATAL) 27-0.8 MG TABS tablet Take 1 tablet by mouth daily at 12 noon.     No current facility-administered medications on file prior to visit.      Exam   Vitals:   11/15/17 1010 11/15/17 1012 11/15/17 1140  BP: (!) 148/81 (!) 144/83 120/85  Pulse: (!) 123 (!) 120 85  Weight: 59.3 kg (130 lb 12.8 oz)        Uterus:     Pelvic Exam: Perineum: no hemorrhoids, normal perineum   Vulva:  normal external genitalia, no lesions   Vagina:  normal mucosa, normal discharge   Cervix: no lesions and normal, pap smear done.    Adnexa: normal adnexa and no mass, fullness, tenderness   Bony Pelvis: average  System: General: well-developed, well-nourished female in no acute distress   Breast:  normal appearance, no masses or tenderness   Skin: normal coloration and turgor, no rashes   Neurologic: oriented, normal, negative, normal mood   Extremities: normal strength, tone, and muscle mass, ROM of all joints is normal   HEENT PERRLA, extraocular movement intact and sclera clear, anicteric   Mouth/Teeth mucous membranes moist, pharynx normal without lesions and dental hygiene good   Neck supple and no masses   Cardiovascular: regular rate and rhythm   Respiratory:  no respiratory distress, normal breath sounds   Abdomen: soft, non-tender; bowel sounds normal; no masses,  no organomegaly   Limited abdominal US:    Indication: Limited OB ultrasound performed for no audible fetal heart rate and hx of  ectopic pregnancy.  Findings: Single IUP with cardiac activity.  CRL: [redacted]w[redacted]d  +FM noted.    Amnion seen.  Posterior placenta.  Umbilical cord noted.  Eye orbits/nasal bone noted. Legs/arms noted.   Impression:  Brianna Jimenez pregnancy with cardiac activity.  Dates c/w LMP and early [place].     Assessment:   Pregnancy: [id number] Patient Active Problem List   Diagnosis Date Noted  . Supervision of high risk pregnancy, antepartum 11/15/2017  . History of ectopic pregnancy 11/15/2017     Plan:  1. Supervision of high risk pregnancy, antepartum      - Cytology - PAP - Hemoglobinopathy evaluation - Varicella zoster antibody, IgG - Cystic Fibrosis Mutation 97 - HgB A1c - Vitamin D (25 hydroxy) - MaterniT21 PLUS Core+SCA - Obstetric Panel, Including HIV - Culture, OB Urine - aspirin 81 MG chewable tablet; Chew 1 tablet (81 mg total) by mouth daily.  Dispense: 30 tablet; Refill: 12 -  Comprehensive metabolic panel - Protein / creatinine ratio, urine - [place] MFM OB DETAIL +14 WK; Future - Prenat-FeAsp-Meth-FA-DHA w/o A (PRENATE PIXIE) 10-0.6-0.4-200 MG CAPS; Take 1 tablet by mouth daily.  Dispense: 30 capsule; Refill: 12 - Cervicovaginal ancillary only  2. Elevated blood pressure today in early pregnancy    - aspirin 81 MG chewable tablet; Chew 1 tablet (81 mg total) by mouth daily.  Dispense: 30 tablet; Refill: 12 - Comprehensive metabolic panel - Protein / creatinine ratio, urine  3. History of ectopic pregnancy     4. Nausea/vomiting in pregnancy    - Doxylamine-Pyridoxine (DICLEGIS) 10-10 MG TBEC; Take 1 tablet with breakfast and lunch.  Take 2 tablets at bedtime.  Dispense: 100 tablet; Refill: 4   Initial labs drawn. Continue prenatal vitamins. Genetic Screening discussed, NIPS: ordered. Ultrasound discussed; fetal anatomic survey: ordered. Problem list reviewed and updated. The nature of [place] with multiple MDs and other Advanced Practice Providers was explained to patient; also emphasized that residents, students are part of our team. Routine obstetric precautions reviewed. Return in about 4 weeks (around 12/13/2017) for ROB.     [facility] for [employer], [place]

## 2017-11-16 LAB — CERVICOVAGINAL ANCILLARY ONLY
BACTERIAL VAGINITIS: POSITIVE — AB
CHLAMYDIA, DNA PROBE: NEGATIVE
Candida vaginitis: POSITIVE — AB
Neisseria Gonorrhea: NEGATIVE
Trichomonas: NEGATIVE

## 2017-11-16 LAB — PROTEIN / CREATININE RATIO, URINE
CREATININE, UR: 232.9 mg/dL
PROTEIN UR: 15.9 mg/dL
PROTEIN/CREAT RATIO: 68 mg/g{creat} (ref 0–200)

## 2017-11-17 ENCOUNTER — Other Ambulatory Visit: Payer: Self-pay | Admitting: Certified Nurse Midwife

## 2017-11-17 DIAGNOSIS — B3731 Acute candidiasis of vulva and vagina: Secondary | ICD-10-CM

## 2017-11-17 DIAGNOSIS — B9689 Other specified bacterial agents as the cause of diseases classified elsewhere: Secondary | ICD-10-CM

## 2017-11-17 DIAGNOSIS — B373 Candidiasis of vulva and vagina: Secondary | ICD-10-CM

## 2017-11-17 DIAGNOSIS — N760 Acute vaginitis: Principal | ICD-10-CM

## 2017-11-17 LAB — CYTOLOGY - PAP
DIAGNOSIS: REACTIVE
Diagnosis: NEGATIVE
HPV (WINDOPATH): NOT DETECTED

## 2017-11-17 MED ORDER — METRONIDAZOLE 0.75 % VA GEL: 1.0000 | Freq: Two times a day (BID) | VAGINAL | 0 refills | Status: DC

## 2017-11-17 MED ORDER — TERCONAZOLE 0.8 % VA CREA: 1.0000 | TOPICAL_CREAM | Freq: Every day | VAGINAL | 0 refills | Status: DC

## 2017-11-19 ENCOUNTER — Other Ambulatory Visit: Payer: Self-pay | Admitting: Certified Nurse Midwife

## 2017-11-19 DIAGNOSIS — E559 Vitamin D deficiency, unspecified: Secondary | ICD-10-CM

## 2017-11-19 DIAGNOSIS — O0990 Supervision of high risk pregnancy, unspecified, unspecified trimester: Secondary | ICD-10-CM

## 2017-11-19 LAB — OBSTETRIC PANEL, INCLUDING HIV
Antibody Screen: NEGATIVE
BASOS ABS: 0 10*3/uL (ref 0.0–0.2)
Basos: 0 %
EOS (ABSOLUTE): 0.1 10*3/uL (ref 0.0–0.4)
Eos: 1 %
HEP B S AG: NEGATIVE
HIV SCREEN 4TH GENERATION: NONREACTIVE
Hematocrit: 39.2 % (ref 34.0–46.6)
Hemoglobin: 13 g/dL (ref 11.1–15.9)
IMMATURE GRANS (ABS): 0 10*3/uL (ref 0.0–0.1)
IMMATURE GRANULOCYTES: 0 %
LYMPHS: 13 %
Lymphocytes Absolute: 1.2 10*3/uL (ref 0.7–3.1)
MCH: 26.5 pg — ABNORMAL LOW (ref 26.6–33.0)
MCHC: 33.2 g/dL (ref 31.5–35.7)
MCV: 80 fL (ref 79–97)
MONOCYTES: 7 %
Monocytes Absolute: 0.6 10*3/uL (ref 0.1–0.9)
NEUTROS ABS: 7.2 10*3/uL — AB (ref 1.4–7.0)
NEUTROS PCT: 79 %
PLATELETS: 287 10*3/uL (ref 150–379)
RBC: 4.9 x10E6/uL (ref 3.77–5.28)
RDW: 14.1 % (ref 12.3–15.4)
RPR: NONREACTIVE
RUBELLA: 4.86 {index} (ref >0.99)
Rh Factor: POSITIVE
WBC: 9.1 10*3/uL (ref 3.4–10.8)

## 2017-11-19 LAB — MATERNIT21 PLUS CORE+SCA
Chromosome 13: NEGATIVE
Chromosome 18: NEGATIVE
Chromosome 21: NEGATIVE
Y CHROMOSOME: NOT DETECTED

## 2017-11-19 LAB — COMPREHENSIVE METABOLIC PANEL
ALBUMIN: 4.3 g/dL (ref 3.5–5.5)
ALK PHOS: 36 IU/L — AB (ref 39–117)
ALT: 6 IU/L (ref 0–32)
AST: 9 IU/L (ref 0–40)
Albumin/Globulin Ratio: 1.4 (ref 1.2–2.2)
BUN / CREAT RATIO: 9 (ref 9–23)
BUN: 6 mg/dL (ref 6–20)
Bilirubin Total: <0.2 mg/dL (ref 0.0–1.2)
CALCIUM: 10 mg/dL (ref 8.7–10.2)
CO2: 20 mmol/L (ref 20–29)
CREATININE: 0.64 mg/dL (ref 0.57–1.00)
Chloride: 102 mmol/L (ref 96–106)
GFR calc Af Amer: 138 mL/min/{1.73_m2} (ref >59)
GFR, EST NON AFRICAN AMERICAN: 120 mL/min/{1.73_m2} (ref >59)
GLUCOSE: 92 mg/dL (ref 65–99)
Globulin, Total: 3 g/dL (ref 1.5–4.5)
Potassium: 4.1 mmol/L (ref 3.5–5.2)
Sodium: 137 mmol/L (ref 134–144)
Total Protein: 7.3 g/dL (ref 6.0–8.5)

## 2017-11-19 LAB — HEMOGLOBINOPATHY EVALUATION
HGB C: 0 %
HGB S: 0 %
HGB VARIANT: 0 %
Hemoglobin A2 Quantitation: 2.2 % (ref 1.8–3.2)
Hemoglobin F Quantitation: 0 % (ref 0.0–2.0)
Hgb A: 97.8 % (ref 96.4–98.8)

## 2017-11-19 LAB — HEMOGLOBIN A1C
ESTIMATED AVERAGE GLUCOSE: 103 mg/dL
Hgb A1c MFr Bld: 5.2 % (ref 4.8–5.6)

## 2017-11-19 LAB — VARICELLA ZOSTER ANTIBODY, IGG: VARICELLA: 979 {index} (ref >165)

## 2017-11-19 LAB — VITAMIN D 25 HYDROXY (VIT D DEFICIENCY, FRACTURES): VIT D 25 HYDROXY: 8.7 ng/mL — AB (ref 30.0–100.0)

## 2017-11-19 MED ORDER — VITAMIN D (ERGOCALCIFEROL) 1.25 MG (50000 UNIT) PO CAPS: 50000.0000 [IU] | ORAL_CAPSULE | ORAL | 2 refills | Status: DC

## 2017-11-20 LAB — URINE CULTURE, OB REFLEX

## 2017-11-20 LAB — CULTURE, OB URINE

## 2017-11-23 ENCOUNTER — Other Ambulatory Visit: Payer: Self-pay | Admitting: Certified Nurse Midwife

## 2017-11-23 DIAGNOSIS — B951 Streptococcus, group B, as the cause of diseases classified elsewhere: Secondary | ICD-10-CM

## 2017-11-23 MED ORDER — NITROFURANTOIN MONOHYD MACRO 100 MG PO CAPS: 100.0000 mg | ORAL_CAPSULE | Freq: Two times a day (BID) | ORAL | 0 refills | Status: DC

## 2017-11-24 ENCOUNTER — Other Ambulatory Visit: Payer: Self-pay | Admitting: Certified Nurse Midwife

## 2017-11-24 DIAGNOSIS — O0990 Supervision of high risk pregnancy, unspecified, unspecified trimester: Secondary | ICD-10-CM

## 2017-11-24 LAB — CYSTIC FIBROSIS MUTATION 97: GENE DIS ANAL CARRIER INTERP BLD/T-IMP: NOT DETECTED

## 2017-12-14 NOTE — L&D Delivery Note (Signed)
Delivery Note Brianna Jimenez is a 32 y.o. A1H1834 at [redacted]w[redacted]d admitted for active labor.  Labor course: progressed rapidly without augmentation Called to room for pt w/ reducible anterior lip and strong urge to push. Ant lip reduced, pt began pushing at first ineffectively, deep variables w/ slow return to baseline. Dr. Nehemiah Settle called to evaluate for vacuum. Pt began pushing better and head delivered w/o complication, shoulders not forthcoming, not improved w/ McRobert's, Dr. Nehemiah Settle stepped in, getting ready to do suprapubic and anterior arm delivered just prior to this step.  Total dystocia time app 30sec. At [phone] a viable female was delivered via spontaneous vaginal delivery (Presentation: ROA).  Infant placed directly on mom's abdomen for bonding/skin-to-skin. Delayed cord clamping x 56min, then cord clamped x 2, and cut by FOB.  APGAR: 8,9 ; weight: pending at time of note.  40 units of pitocin diluted in 1000cc LR was infused rapidly IV per protocol. The placenta separated spontaneously and delivered via CCT and maternal pushing effort.  It was inspected and appears to be intact with a 3 VC.  Placenta/Cord with the following complications: none .  Cord pH: not done  Intrapartum complications:  None Anesthesia:  epidural Episiotomy: none Lacerations:  none Suture Repair: n/a Est. Blood Loss (mL): 100 Sponge and instrument count were correct x2.  Mom to postpartum.  Baby to Couplet care / Skin to Skin. Placenta to L&D. Plans to breast & bottlefeed Contraception: undecided Circ: n/a  Roma Schanz CNM, WHNP-BC 06/01/2018 9:16 AM   Gertie Exon, Royetta Crochet, CNM  P [facility]        Please schedule this patient for PP visit in: 6 weeks  Low risk pregnancy complicated by: none  Delivery mode: SVD  Anticipated Birth Control: other/unsure  PP Procedures needed: none  Schedule Integrated BH visit: no  Provider: Any provider

## 2017-12-15 ENCOUNTER — Other Ambulatory Visit: Payer: Self-pay

## 2017-12-15 ENCOUNTER — Ambulatory Visit (INDEPENDENT_AMBULATORY_CARE_PROVIDER_SITE_OTHER): Payer: Medicaid Other | Admitting: Nurse Practitioner

## 2017-12-15 ENCOUNTER — Encounter: Payer: Self-pay | Admitting: Nurse Practitioner

## 2017-12-15 DIAGNOSIS — O0992 Supervision of high risk pregnancy, unspecified, second trimester: Secondary | ICD-10-CM

## 2017-12-15 DIAGNOSIS — O0990 Supervision of high risk pregnancy, unspecified, unspecified trimester: Secondary | ICD-10-CM

## 2017-12-15 NOTE — Patient Instructions (Addendum)

## 2017-12-15 NOTE — Progress Notes (Addendum)
    Subjective:  Brianna Jimenez is a 32 y.o. [id number] at [redacted]w[redacted]d being seen today for ongoing prenatal care.  She is currently monitored for the following issues for this high-risk pregnancy (had elevated BPs at NOB visit) and has Supervision of high risk pregnancy, antepartum; History of ectopic pregnancy; Vitamin D deficiency; and Positive GBS test on their problem list.  Patient reports nasal congestion, nose bleeds, and constipation..  Contractions: Not present. Vag. Bleeding: None.  Movement: Present. Denies leaking of fluid.   The following portions of the patient's history were reviewed and updated as appropriate: allergies, current medications, past family history, past medical history, past social history, past surgical history and problem list. Problem list updated.  Objective:   Vitals:   12/15/17 1015  BP: 126/72  Pulse: 78  Weight: 131 lb (59.4 kg)    Fetal Status: Fetal Heart Rate (bpm): 150; doppler Fundal Height: 19 cm Movement: Present     General:  Alert, oriented and cooperative. Patient is in no acute distress.  Skin: Skin is warm and dry. No rash noted.   Cardiovascular: Normal heart rate noted  Respiratory: Normal respiratory effort, no problems with respiration noted  Abdomen: Soft, gravid, appropriate for gestational age. Pain/Pressure: Present     Pelvic:  Cervical exam deferred        Extremities: Normal range of motion.  Edema: None  Mental Status: Normal mood and affect. Normal behavior. Normal judgment and thought content.   Urinalysis:      Assessment and Plan:  Pregnancy: G5P1031 at [redacted]w[redacted]d  1. Supervision of high risk pregnancy, antepartum   - Client is feeling fetal movement at 15 weeks and her fundal height was greater than her dates today.  Will continue to watch fundal height.  Has [place] for anatomy scheduled 01-17-18.  Only has low back pain when walking for extended periods of time at the mall.  Advised rest periods as needed to reduce back  pain.  - AFP, Serum, Open Spina Bifida - client forgot to get blood drawn today before leaving the office - called and left a message on her phone to come back by the office today and get blood drawn and sent My Chart message to come back by and get blood drawn today if possible.  2.  Constipation Discussed dietary fiber with client - will get raisin bran as it usually works for her.  Advised to drink 64 oz of water daily.  May also use an over the counter stool softener if needed.  3.  Nasal congestion and nose bleeds Discussed with client.  Can take Benadryl as needed for congestion - is normal in pregnancy for many women.  Sent a My Chart message to client to try OTC claritin for her nasal stuffiness which may be better tolerated than Benadryl.  Preterm labor symptoms and general obstetric precautions including but not limited to vaginal bleeding, contractions, leaking of fluid and fetal movement were reviewed in detail with the patient. Please refer to After Visit Summary for other counseling recommendations.  Return in about 4 weeks (around 01/12/2018).  Earlie Server, RN, MSN, NP-BC Nurse Practitioner, [facility] for [employer], [place] [facility] 12/15/2017 12:35 PM  Nurse' note: Complains of lower back pain 6/10 x 8 days. Nasal congestion with nose bleeds for 4 months.  Constipation 4-5/week x 1 month.

## 2017-12-16 ENCOUNTER — Encounter: Payer: Self-pay | Admitting: Nurse Practitioner

## 2017-12-24 LAB — AFP, SERUM, OPEN SPINA BIFIDA
AFP MOM: 0.83
AFP VALUE AFPOSL: 32.1 ng/mL
Gest. Age on Collection Date: 16.1 weeks
Maternal Age At EDD: 31.4 yr
OSBR Risk 1 IN: 10000
TEST RESULTS AFP: NEGATIVE
Weight: 131 [lb_av]

## 2018-01-02 ENCOUNTER — Other Ambulatory Visit: Payer: Self-pay | Admitting: Certified Nurse Midwife

## 2018-01-02 DIAGNOSIS — B373 Candidiasis of vulva and vagina: Secondary | ICD-10-CM

## 2018-01-02 DIAGNOSIS — B3731 Acute candidiasis of vulva and vagina: Secondary | ICD-10-CM

## 2018-01-03 ENCOUNTER — Other Ambulatory Visit: Payer: Self-pay | Admitting: Certified Nurse Midwife

## 2018-01-03 ENCOUNTER — Encounter: Payer: Self-pay | Admitting: Certified Nurse Midwife

## 2018-01-03 DIAGNOSIS — B3731 Acute candidiasis of vulva and vagina: Secondary | ICD-10-CM

## 2018-01-03 DIAGNOSIS — B373 Candidiasis of vulva and vagina: Secondary | ICD-10-CM

## 2018-01-03 MED ORDER — FLUCONAZOLE 150 MG PO TABS: 150.0000 mg | ORAL_TABLET | Freq: Once | ORAL | 0 refills | Status: AC

## 2018-01-04 MED ORDER — TERCONAZOLE 0.8 % VA CREA: 1.0000 | TOPICAL_CREAM | Freq: Every day | VAGINAL | 0 refills | Status: DC

## 2018-01-07 ENCOUNTER — Encounter (HOSPITAL_COMMUNITY): Payer: Self-pay | Admitting: Certified Nurse Midwife

## 2018-01-12 ENCOUNTER — Encounter: Payer: Self-pay | Admitting: Obstetrics & Gynecology

## 2018-01-12 ENCOUNTER — Ambulatory Visit (INDEPENDENT_AMBULATORY_CARE_PROVIDER_SITE_OTHER): Payer: Medicaid Other | Admitting: Obstetrics & Gynecology

## 2018-01-12 DIAGNOSIS — O0990 Supervision of high risk pregnancy, unspecified, unspecified trimester: Secondary | ICD-10-CM

## 2018-01-12 NOTE — Progress Notes (Signed)
   PRENATAL VISIT NOTE  Subjective:  Brianna Jimenez is a 32 y.o. [id number] at [redacted]w[redacted]d being seen today for ongoing prenatal care.  She is currently monitored for the following issues for this low-risk pregnancy and has Supervision of high risk pregnancy, antepartum; History of ectopic pregnancy; Vitamin D deficiency; and Positive GBS test on their problem list.  Patient reports no complaints.  Contractions: Not present. Vag. Bleeding: None.  Movement: Present. Denies leaking of fluid.   The following portions of the patient's history were reviewed and updated as appropriate: allergies, current medications, past family history, past medical history, past social history, past surgical history and problem list. Problem list updated.  Objective:   Vitals:   01/12/18 1315  BP: 123/88  Pulse: 81  Weight: 143 lb 1.6 oz (64.9 kg)    Fetal Status: Fetal Heart Rate (bpm):  150 Fundal Height: 20 cm Movement: Present     General:  Alert, oriented and cooperative. Patient is in no acute distress.  Skin: Skin is warm and dry. No rash noted.   Cardiovascular: Normal heart rate noted  Respiratory: Normal respiratory effort, no problems with respiration noted  Abdomen: Soft, gravid, appropriate for gestational age.  Pain/Pressure: Present     Pelvic: Cervical exam deferred        Extremities: Normal range of motion.  Edema: None  Mental Status:  Normal mood and affect. Normal behavior. Normal judgment and thought content.   Assessment and Plan:  Pregnancy: [id number] at [redacted]w[redacted]d  1. Supervision of high risk pregnancy, antepartum Has [place] next week  Preterm labor symptoms and general obstetric precautions including but not limited to vaginal bleeding, contractions, leaking of fluid and fetal movement were reviewed in detail with the patient. Please refer to After Visit Summary for other counseling recommendations.  Return in about 4 weeks (around 02/09/2018).   Emeterio Reeve, MD

## 2018-01-17 ENCOUNTER — Other Ambulatory Visit (HOSPITAL_COMMUNITY): Payer: Medicaid Other

## 2018-01-18 ENCOUNTER — Ambulatory Visit (HOSPITAL_COMMUNITY)
Admission: RE | Admit: 2018-01-18 | Discharge: 2018-01-18 | Disposition: A | Discharge status: Home / Self Care | Payer: Medicaid Other | Source: Ambulatory Visit | Attending: Certified Nurse Midwife | Admitting: Certified Nurse Midwife

## 2018-01-18 ENCOUNTER — Other Ambulatory Visit: Payer: Self-pay | Admitting: Certified Nurse Midwife

## 2018-01-18 DIAGNOSIS — Z3A20 20 weeks gestation of pregnancy: Secondary | ICD-10-CM

## 2018-01-18 DIAGNOSIS — O321XX0 Maternal care for breech presentation, not applicable or unspecified: Secondary | ICD-10-CM | POA: Diagnosis not present

## 2018-01-18 DIAGNOSIS — O0990 Supervision of high risk pregnancy, unspecified, unspecified trimester: Secondary | ICD-10-CM

## 2018-01-18 DIAGNOSIS — Z363 Encounter for antenatal screening for malformations: Secondary | ICD-10-CM

## 2018-01-19 ENCOUNTER — Telehealth: Payer: Self-pay

## 2018-01-19 NOTE — Telephone Encounter (Signed)
TC to pt regarding message  Pt was concerned about anatomy U/S results.  I consulted w/ Dr.Harper to get the verbal to let pt know it was only due to fetal position that we did not see everything and that is why the f/u is needed.   Pt voiced understanding of reassurance.

## 2018-02-08 ENCOUNTER — Ambulatory Visit (INDEPENDENT_AMBULATORY_CARE_PROVIDER_SITE_OTHER): Payer: Medicaid Other | Admitting: Obstetrics & Gynecology

## 2018-02-08 VITALS — BP 129/81 | HR 83 | Wt 149.4 lb

## 2018-02-08 DIAGNOSIS — O0990 Supervision of high risk pregnancy, unspecified, unspecified trimester: Secondary | ICD-10-CM

## 2018-02-08 DIAGNOSIS — B951 Streptococcus, group B, as the cause of diseases classified elsewhere: Secondary | ICD-10-CM

## 2018-02-08 NOTE — Progress Notes (Signed)
   PRENATAL VISIT NOTE  Subjective:  Brianna Jimenez is a 32 y.o. [id number] at [redacted]w[redacted]d being seen today for ongoing prenatal care.  She is currently monitored for the following issues for this low-risk pregnancy and has Supervision of high risk pregnancy, antepartum; History of ectopic pregnancy; Vitamin D deficiency; and Positive GBS test on their problem list.  Patient reports no complaints.  Contractions: Not present. Vag. Bleeding: None.  Movement: Present. Denies leaking of fluid.   The following portions of the patient's history were reviewed and updated as appropriate: allergies, current medications, past family history, past medical history, past social history, past surgical history and problem list. Problem list updated.  Objective:   Vitals:   02/08/18 1334  BP: 129/81  Pulse: 83  Weight: 149 lb 6.4 oz (67.8 kg)    Fetal Status: Fetal Heart Rate (bpm): 148 Fundal Height: 23 cm Movement: Present     General:  Alert, oriented and cooperative. Patient is in no acute distress.  Skin: Skin is warm and dry. No rash noted.   Cardiovascular: Normal heart rate noted  Respiratory: Normal respiratory effort, no problems with respiration noted  Abdomen: Soft, gravid, appropriate for gestational age.  Pain/Pressure: Absent     Pelvic: Cervical exam deferred        Extremities: Normal range of motion.  Edema: None  Mental Status:  Normal mood and affect. Normal behavior. Normal judgment and thought content.   Assessment and Plan:  Pregnancy: [id number] at [redacted]w[redacted]d  1. Supervision of high risk pregnancy, antepartum - follow up MFM u/s on 03-01-18  2. Positive GBS test in urine - treat in labor  Preterm labor symptoms and general obstetric precautions including but not limited to vaginal bleeding, contractions, leaking of fluid and fetal movement were reviewed in detail with the patient. Please refer to After Visit Summary for other counseling recommendations.  Return in about 4 weeks  (around 03/08/2018) for 2 hour GTT.   Emily Filbert, MD

## 2018-03-01 ENCOUNTER — Ambulatory Visit (HOSPITAL_COMMUNITY)
Admission: RE | Admit: 2018-03-01 | Discharge: 2018-03-01 | Disposition: A | Discharge status: Home / Self Care | Payer: Medicaid Other | Source: Ambulatory Visit | Attending: Certified Nurse Midwife | Admitting: Certified Nurse Midwife

## 2018-03-01 DIAGNOSIS — Z362 Encounter for other antenatal screening follow-up: Secondary | ICD-10-CM | POA: Insufficient documentation

## 2018-03-01 DIAGNOSIS — Z3A26 26 weeks gestation of pregnancy: Secondary | ICD-10-CM | POA: Diagnosis not present

## 2018-03-01 DIAGNOSIS — Z3402 Encounter for supervision of normal first pregnancy, second trimester: Secondary | ICD-10-CM | POA: Insufficient documentation

## 2018-03-01 DIAGNOSIS — O0990 Supervision of high risk pregnancy, unspecified, unspecified trimester: Secondary | ICD-10-CM

## 2018-03-02 ENCOUNTER — Other Ambulatory Visit: Payer: Self-pay | Admitting: Certified Nurse Midwife

## 2018-03-02 DIAGNOSIS — O0990 Supervision of high risk pregnancy, unspecified, unspecified trimester: Secondary | ICD-10-CM

## 2018-03-08 ENCOUNTER — Ambulatory Visit (INDEPENDENT_AMBULATORY_CARE_PROVIDER_SITE_OTHER): Payer: Medicaid Other | Admitting: Obstetrics & Gynecology

## 2018-03-08 ENCOUNTER — Encounter: Payer: Self-pay | Admitting: Obstetrics & Gynecology

## 2018-03-08 ENCOUNTER — Other Ambulatory Visit (HOSPITAL_COMMUNITY)
Admission: RE | Admit: 2018-03-08 | Discharge: 2018-03-08 | Disposition: A | Discharge status: Home / Self Care | Payer: Medicaid Other | Source: Ambulatory Visit | Attending: Obstetrics & Gynecology | Admitting: Obstetrics & Gynecology

## 2018-03-08 ENCOUNTER — Other Ambulatory Visit: Payer: Medicaid Other

## 2018-03-08 VITALS — BP 119/78 | HR 80 | Wt 157.9 lb

## 2018-03-08 DIAGNOSIS — N898 Other specified noninflammatory disorders of vagina: Secondary | ICD-10-CM

## 2018-03-08 DIAGNOSIS — O26893 Other specified pregnancy related conditions, third trimester: Secondary | ICD-10-CM | POA: Insufficient documentation

## 2018-03-08 DIAGNOSIS — O0993 Supervision of high risk pregnancy, unspecified, third trimester: Secondary | ICD-10-CM

## 2018-03-08 DIAGNOSIS — Z3A27 27 weeks gestation of pregnancy: Secondary | ICD-10-CM | POA: Diagnosis not present

## 2018-03-08 DIAGNOSIS — O0990 Supervision of high risk pregnancy, unspecified, unspecified trimester: Secondary | ICD-10-CM

## 2018-03-08 NOTE — Progress Notes (Signed)
   PRENATAL VISIT NOTE  Subjective:  Brianna Jimenez is a 32 y.o. [id number] at [redacted]w[redacted]d being seen today for ongoing prenatal care.  She is currently monitored for the following issues for this low-risk pregnancy and has Supervision of high risk pregnancy, antepartum; History of ectopic pregnancy; Vitamin D deficiency; and Positive GBS test on their problem list.  Patient reports vaginal irritation.  Contractions: Not present. Vag. Bleeding: None.  Movement: Present. Denies leaking of fluid.   The following portions of the patient's history were reviewed and updated as appropriate: allergies, current medications, past family history, past medical history, past social history, past surgical history and problem list. Problem list updated.  Objective:   Vitals:   03/08/18 0809  BP: 119/78  Pulse: 80  Weight: 157 lb 14.4 oz (71.6 kg)    Fetal Status: Fetal Heart Rate (bpm): 143   Movement: Present     General:  Alert, oriented and cooperative. Patient is in no acute distress.  Skin: Skin is warm and dry. No rash noted.   Cardiovascular: Normal heart rate noted  Respiratory: Normal respiratory effort, no problems with respiration noted  Abdomen: Soft, gravid, appropriate for gestational age.  Pain/Pressure: Absent     Pelvic: Cervical exam deferred        Extremities: Normal range of motion.  Edema: None  Mental Status:  Normal mood and affect. Normal behavior. Normal judgment and thought content.   - speculum exam showed cream c/w metrogel  Assessment and Plan:  Pregnancy: [id number] at [redacted]w[redacted]d  1. Supervision of high risk pregnancy, antepartum - She declines TDAP as of today - Glucose Tolerance, 2 Hours w/1 Hour - CBC - HIV antibody - RPR 2. Vaginal discharge- wet prep sent. She is currently using metrogel for BV  Preterm labor symptoms and general obstetric precautions including but not limited to vaginal bleeding, contractions, leaking of fluid and fetal movement were reviewed in  detail with the patient. Please refer to After Visit Summary for other counseling recommendations.  Return in about 3 weeks (around 03/29/2018).   Emily Filbert, MD

## 2018-03-09 LAB — CBC
Hematocrit: 37.2 % (ref 34.0–46.6)
Hemoglobin: 11.2 g/dL (ref 11.1–15.9)
MCH: 24.8 pg — ABNORMAL LOW (ref 26.6–33.0)
MCHC: 30.1 g/dL — ABNORMAL LOW (ref 31.5–35.7)
MCV: 82 fL (ref 79–97)
Platelets: 276 x10E3/uL (ref 150–379)
RBC: 4.52 x10E6/uL (ref 3.77–5.28)
RDW: 13.9 % (ref 12.3–15.4)
WBC: 10.3 x10E3/uL (ref 3.4–10.8)

## 2018-03-09 LAB — RPR: RPR Ser Ql: NONREACTIVE

## 2018-03-09 LAB — CERVICOVAGINAL ANCILLARY ONLY
Bacterial vaginitis: NEGATIVE
Candida vaginitis: NEGATIVE

## 2018-03-09 LAB — GLUCOSE TOLERANCE, 2 HOURS W/ 1HR
Glucose, 1 hour: 108 mg/dL (ref 65–179)
Glucose, 2 hour: 87 mg/dL (ref 65–152)
Glucose, Fasting: 73 mg/dL (ref 65–91)

## 2018-03-09 LAB — HIV ANTIBODY (ROUTINE TESTING W REFLEX): HIV Screen 4th Generation wRfx: NONREACTIVE

## 2018-04-07 ENCOUNTER — Other Ambulatory Visit (HOSPITAL_COMMUNITY)
Admission: RE | Admit: 2018-04-07 | Discharge: 2018-04-07 | Disposition: A | Discharge status: Home / Self Care | Payer: Medicaid Other | Source: Ambulatory Visit | Attending: Obstetrics and Gynecology | Admitting: Obstetrics and Gynecology

## 2018-04-07 ENCOUNTER — Ambulatory Visit (INDEPENDENT_AMBULATORY_CARE_PROVIDER_SITE_OTHER): Payer: Medicaid Other | Admitting: Obstetrics and Gynecology

## 2018-04-07 ENCOUNTER — Encounter: Payer: Self-pay | Admitting: Obstetrics and Gynecology

## 2018-04-07 VITALS — BP 113/64 | HR 108 | Wt 161.9 lb

## 2018-04-07 DIAGNOSIS — N898 Other specified noninflammatory disorders of vagina: Secondary | ICD-10-CM | POA: Diagnosis present

## 2018-04-07 DIAGNOSIS — O0990 Supervision of high risk pregnancy, unspecified, unspecified trimester: Secondary | ICD-10-CM

## 2018-04-07 NOTE — Progress Notes (Addendum)
   PRENATAL VISIT NOTE  Subjective:  Brianna Jimenez is a 32 y.o. [id number] at [redacted]w[redacted]d being seen today for ongoing prenatal care.  She is currently monitored for the following issues for this low-risk pregnancy and has Supervision of high risk pregnancy, antepartum; History of ectopic pregnancy; Vitamin D deficiency; and Positive GBS test on their problem list.  Patient reports backache and throbbing in back.  Contractions: Irregular. Vag. Bleeding: None.  Movement: Present. Denies leaking of fluid. Some vaginal discharge, no leaking.    The following portions of the patient's history were reviewed and updated as appropriate: allergies, current medications, past family history, past medical history, past social history, past surgical history and problem list. Problem list updated.  Objective:   Vitals:   04/07/18 0830  BP: 113/64  Pulse: (!) 108  Weight: 161 lb 14.4 oz (73.4 kg)    Fetal Status: Fetal Heart Rate (bpm): 145 Fundal Height: 31 cm Movement: Present     General:  Alert, oriented and cooperative. Patient is in no acute distress.  Skin: Skin is warm and dry. No rash noted.   Cardiovascular: Normal heart rate noted  Respiratory: Normal respiratory effort, no problems with respiration noted  Abdomen: Soft, gravid, appropriate for gestational age.  Pain/Pressure: Present     Pelvic: Cervical exam deferred      small amount white discharge in vagina, cervix normal appearing  Extremities: Normal range of motion.  Edema: None  Mental Status: Normal mood and affect. Normal behavior. Normal judgment and thought content.   Assessment and Plan:  Pregnancy: [id number] at [redacted]w[redacted]d  1. Supervision of high risk pregnancy, antepartum Declines contraception  2. Vaginal discharge Wet prep sent   Preterm labor symptoms and general obstetric precautions including but not limited to vaginal bleeding, contractions, leaking of fluid and fetal movement were reviewed in detail with the  patient. Please refer to After Visit Summary for other counseling recommendations.  Return in about 2 weeks (around 04/21/2018) for OB visit.  Future Appointments  Date Time Provider [place]  04/21/2018  9:30 AM Sloan Leiter, MD [facility] None    Sloan Leiter, MD

## 2018-04-07 NOTE — Progress Notes (Signed)
Pt c/o leg cramps, low back pain, and vomiting.

## 2018-04-08 ENCOUNTER — Encounter: Payer: Self-pay | Admitting: Obstetrics and Gynecology

## 2018-04-08 LAB — CERVICOVAGINAL ANCILLARY ONLY
Bacterial vaginitis: NEGATIVE
CHLAMYDIA, DNA PROBE: NEGATIVE
Candida vaginitis: NEGATIVE
Neisseria Gonorrhea: NEGATIVE
Trichomonas: NEGATIVE

## 2018-04-11 ENCOUNTER — Encounter: Payer: Self-pay | Admitting: Obstetrics and Gynecology

## 2018-04-21 ENCOUNTER — Ambulatory Visit (INDEPENDENT_AMBULATORY_CARE_PROVIDER_SITE_OTHER): Payer: Medicaid Other | Admitting: Obstetrics and Gynecology

## 2018-04-21 ENCOUNTER — Encounter: Payer: Self-pay | Admitting: Obstetrics and Gynecology

## 2018-04-21 VITALS — BP 115/71 | HR 85 | Wt 165.1 lb

## 2018-04-21 DIAGNOSIS — O0990 Supervision of high risk pregnancy, unspecified, unspecified trimester: Secondary | ICD-10-CM

## 2018-04-21 DIAGNOSIS — B951 Streptococcus, group B, as the cause of diseases classified elsewhere: Secondary | ICD-10-CM

## 2018-04-21 NOTE — Progress Notes (Signed)
   PRENATAL VISIT NOTE  Subjective:  Brianna Jimenez is a 32 y.o. [id number] at [redacted]w[redacted]d being seen today for ongoing prenatal care.  She is currently monitored for the following issues for this low-risk pregnancy and has Supervision of high risk pregnancy, antepartum; History of ectopic pregnancy; Vitamin D deficiency; and Positive GBS test on their problem list.  Patient reports occasional contractions.  Contractions: Irregular. Vag. Bleeding: None.  Movement: Present. Denies leaking of fluid.   The following portions of the patient's history were reviewed and updated as appropriate: allergies, current medications, past family history, past medical history, past social history, past surgical history and problem list. Problem list updated.  Objective:   Vitals:   04/21/18 0924  BP: 115/71  Pulse: 85  Weight: 165 lb 1.6 oz (74.9 kg)    Fetal Status: Fetal Heart Rate (bpm): 145 Fundal Height: 34 cm Movement: Present     General:  Alert, oriented and cooperative. Patient is in no acute distress.  Skin: Skin is warm and dry. No rash noted.   Cardiovascular: Normal heart rate noted  Respiratory: Normal respiratory effort, no problems with respiration noted  Abdomen: Soft, gravid, appropriate for gestational age.  Pain/Pressure: Absent     Pelvic: Cervical exam deferred        Extremities: Normal range of motion.  Edema: Trace  Mental Status: Normal mood and affect. Normal behavior. Normal judgment and thought content.   Assessment and Plan:  Pregnancy: [id number] at [redacted]w[redacted]d  1. Supervision of high risk pregnancy, antepartum  2. Positive GBS test ppx in labor  Preterm labor symptoms and general obstetric precautions including but not limited to vaginal bleeding, contractions, leaking of fluid and fetal movement were reviewed in detail with the patient. Please refer to After Visit Summary for other counseling recommendations.  Return in about 2 weeks (around 05/05/2018) for OB  visit.  Future Appointments  Date Time Provider [place]  05/05/2018  9:30 AM Chancy Milroy, MD [facility] None    Sloan Leiter, MD

## 2018-05-05 ENCOUNTER — Other Ambulatory Visit (HOSPITAL_COMMUNITY)
Admission: RE | Admit: 2018-05-05 | Discharge: 2018-05-05 | Disposition: A | Discharge status: Home / Self Care | Payer: Medicaid Other | Source: Ambulatory Visit | Attending: Obstetrics and Gynecology | Admitting: Obstetrics and Gynecology

## 2018-05-05 ENCOUNTER — Ambulatory Visit (INDEPENDENT_AMBULATORY_CARE_PROVIDER_SITE_OTHER): Payer: Medicaid Other | Admitting: Obstetrics and Gynecology

## 2018-05-05 VITALS — BP 115/73 | HR 80 | Wt 165.5 lb

## 2018-05-05 DIAGNOSIS — N898 Other specified noninflammatory disorders of vagina: Secondary | ICD-10-CM | POA: Insufficient documentation

## 2018-05-05 DIAGNOSIS — O0993 Supervision of high risk pregnancy, unspecified, third trimester: Secondary | ICD-10-CM

## 2018-05-05 DIAGNOSIS — O0990 Supervision of high risk pregnancy, unspecified, unspecified trimester: Secondary | ICD-10-CM

## 2018-05-05 DIAGNOSIS — B951 Streptococcus, group B, as the cause of diseases classified elsewhere: Secondary | ICD-10-CM

## 2018-05-05 NOTE — Patient Instructions (Signed)
Third Trimester of Pregnancy The third trimester is from week 28 through week 40 (months 7 through 9). The third trimester is a time when the unborn baby (fetus) is growing rapidly. At the end of the ninth month, the fetus is about 20 inches in length and weighs 6-10 pounds. Body changes during your third trimester Your body will continue to go through many changes during pregnancy. The changes vary from woman to woman. During the third trimester:  Your weight will continue to increase. You can expect to gain 25-35 pounds (11-16 kg) by the end of the pregnancy.  You may begin to get stretch marks on your hips, abdomen, and breasts.  You may urinate more often because the fetus is moving lower into your pelvis and pressing on your bladder.  You may develop or continue to have heartburn. This is caused by increased hormones that slow down muscles in the digestive tract.  You may develop or continue to have constipation because increased hormones slow digestion and cause the muscles that push waste through your intestines to relax.  You may develop hemorrhoids. These are swollen veins (varicose veins) in the rectum that can itch or be painful.  You may develop swollen, bulging veins (varicose veins) in your legs.  You may have increased body aches in the pelvis, back, or thighs. This is due to weight gain and increased hormones that are relaxing your joints.  You may have changes in your hair. These can include thickening of your hair, rapid growth, and changes in texture. Some women also have hair loss during or after pregnancy, or hair that feels dry or thin. Your hair will most likely return to normal after your baby is born.  Your breasts will continue to grow and they will continue to become tender. A yellow fluid (colostrum) may leak from your breasts. This is the first milk you are producing for your baby.  Your belly button may stick out.  You may notice more swelling in your hands,  face, or ankles.  You may have increased tingling or numbness in your hands, arms, and legs. The skin on your belly may also feel numb.  You may feel short of breath because of your expanding uterus.  You may have more problems sleeping. This can be caused by the size of your belly, increased need to urinate, and an increase in your body's metabolism.  You may notice the fetus "dropping," or moving lower in your abdomen (lightening).  You may have increased vaginal discharge.  You may notice your joints feel loose and you may have pain around your pelvic bone.  What to expect at prenatal visits You will have prenatal exams every 2 weeks until week 36. Then you will have weekly prenatal exams. During a routine prenatal visit:  You will be weighed to make sure you and the baby are growing normally.  Your blood pressure will be taken.  Your abdomen will be measured to track your baby's growth.  The fetal heartbeat will be listened to.  Any test results from the previous visit will be discussed.  You may have a cervical check near your due date to see if your cervix has softened or thinned (effaced).  You will be tested for Group B streptococcus. This happens between 35 and 37 weeks.  Your health care provider may ask you:  What your birth plan is.  How you are feeling.  If you are feeling the baby move.  If you have had   any abnormal symptoms, such as leaking fluid, bleeding, severe headaches, or abdominal cramping.  If you are using any tobacco products, including cigarettes, chewing tobacco, and electronic cigarettes.  If you have any questions.  Other tests or screenings that may be performed during your third trimester include:  Blood tests that check for low iron levels (anemia).  Fetal testing to check the health, activity level, and growth of the fetus. Testing is done if you have certain medical conditions or if there are problems during the  pregnancy.  Nonstress test (NST). This test checks the health of your baby to make sure there are no signs of problems, such as the baby not getting enough oxygen. During this test, a belt is placed around your belly. The baby is made to move, and its heart rate is monitored during movement.  What is false labor? False labor is a condition in which you feel small, irregular tightenings of the muscles in the womb (contractions) that usually go away with rest, changing position, or drinking water. These are called Braxton Hicks contractions. Contractions may last for hours, days, or even weeks before true labor sets in. If contractions come at regular intervals, become more frequent, increase in intensity, or become painful, you should see your health care provider. What are the signs of labor?  Abdominal cramps.  Regular contractions that start at 10 minutes apart and become stronger and more frequent with time.  Contractions that start on the top of the uterus and spread down to the lower abdomen and back.  Increased pelvic pressure and dull back pain.  A watery or bloody mucus discharge that comes from the vagina.  Leaking of amniotic fluid. This is also known as your "water breaking." It could be a slow trickle or a gush. Let your health care provider know if it has a color or strange odor. If you have any of these signs, call your health care provider right away, even if it is before your due date. Follow these instructions at home: Medicines  Follow your health care provider's instructions regarding medicine use. Specific medicines may be either safe or unsafe to take during pregnancy.  Take a prenatal vitamin that contains at least 600 micrograms (mcg) of folic acid.  If you develop constipation, try taking a stool softener if your health care provider approves. Eating and drinking  Eat a balanced diet that includes fresh fruits and vegetables, whole grains, good sources of protein  such as meat, eggs, or tofu, and low-fat dairy. Your health care provider will help you determine the amount of weight gain that is right for you.  Avoid raw meat and uncooked cheese. These carry germs that can cause birth defects in the baby.  If you have low calcium intake from food, talk to your health care provider about whether you should take a daily calcium supplement.  Eat four or five small meals rather than three large meals a day.  Limit foods that are high in fat and processed sugars, such as fried and sweet foods.  To prevent constipation: ? Drink enough fluid to keep your urine clear or pale yellow. ? Eat foods that are high in fiber, such as fresh fruits and vegetables, whole grains, and beans. Activity  Exercise only as directed by your health care provider. Most women can continue their usual exercise routine during pregnancy. Try to exercise for 30 minutes at least 5 days a week. Stop exercising if you experience uterine contractions.  Avoid heavy   lifting.  Do not exercise in extreme heat or humidity, or at high altitudes.  Wear low-heel, comfortable shoes.  Practice good posture.  You may continue to have sex unless your health care provider tells you otherwise. Relieving pain and discomfort  Take frequent breaks and rest with your legs elevated if you have leg cramps or low back pain.  Take warm sitz baths to soothe any pain or discomfort caused by hemorrhoids. Use hemorrhoid cream if your health care provider approves.  Wear a good support bra to prevent discomfort from breast tenderness.  If you develop varicose veins: ? Wear support pantyhose or compression stockings as told by your healthcare provider. ? Elevate your feet for 15 minutes, 3-4 times a day. Prenatal care  Write down your questions. Take them to your prenatal visits.  Keep all your prenatal visits as told by your health care provider. This is important. Safety  Wear your seat belt at  all times when driving.  Make a list of emergency phone numbers, including numbers for family, friends, the hospital, and police and fire departments. General instructions  Avoid cat litter boxes and soil used by cats. These carry germs that can cause birth defects in the baby. If you have a cat, ask someone to clean the litter box for you.  Do not travel far distances unless it is absolutely necessary and only with the approval of your health care provider.  Do not use hot tubs, steam rooms, or saunas.  Do not drink alcohol.  Do not use any products that contain nicotine or tobacco, such as cigarettes and e-cigarettes. If you need help quitting, ask your health care provider.  Do not use any medicinal herbs or unprescribed drugs. These chemicals affect the formation and growth of the baby.  Do not douche or use tampons or scented sanitary pads.  Do not cross your legs for long periods of time.  To prepare for the arrival of your baby: ? Take prenatal classes to understand, practice, and ask questions about labor and delivery. ? Make a trial run to the hospital. ? Visit the hospital and tour the maternity area. ? Arrange for maternity or paternity leave through employers. ? Arrange for family and friends to take care of pets while you are in the hospital. ? Purchase a rear-facing car seat and make sure you know how to install it in your car. ? Pack your hospital bag. ? Prepare the baby's nursery. Make sure to remove all pillows and stuffed animals from the baby's crib to prevent suffocation.  Visit your dentist if you have not gone during your pregnancy. Use a soft toothbrush to brush your teeth and be gentle when you floss. Contact a health care provider if:  You are unsure if you are in labor or if your water has broken.  You become dizzy.  You have mild pelvic cramps, pelvic pressure, or nagging pain in your abdominal area.  You have lower back pain.  You have persistent  nausea, vomiting, or diarrhea.  You have an unusual or bad smelling vaginal discharge.  You have pain when you urinate. Get help right away if:  Your water breaks before 37 weeks.  You have regular contractions less than 5 minutes apart before 37 weeks.  You have a fever.  You are leaking fluid from your vagina.  You have spotting or bleeding from your vagina.  You have severe abdominal pain or cramping.  You have rapid weight loss or weight gain.    You have shortness of breath with chest pain.  You notice sudden or extreme swelling of your face, hands, ankles, feet, or legs.  Your baby makes fewer than 10 movements in 2 hours.  You have severe headaches that do not go away when you take medicine.  You have vision changes. Summary  The third trimester is from week 28 through week 40, months 7 through 9. The third trimester is a time when the unborn baby (fetus) is growing rapidly.  During the third trimester, your discomfort may increase as you and your baby continue to gain weight. You may have abdominal, leg, and back pain, sleeping problems, and an increased need to urinate.  During the third trimester your breasts will keep growing and they will continue to become tender. A yellow fluid (colostrum) may leak from your breasts. This is the first milk you are producing for your baby.  False labor is a condition in which you feel small, irregular tightenings of the muscles in the womb (contractions) that eventually go away. These are called Braxton Hicks contractions. Contractions may last for hours, days, or even weeks before true labor sets in.  Signs of labor can include: abdominal cramps; regular contractions that start at 10 minutes apart and become stronger and more frequent with time; watery or bloody mucus discharge that comes from the vagina; increased pelvic pressure and dull back pain; and leaking of amniotic fluid. This information is not intended to replace advice  given to you by your health care provider. Make sure you discuss any questions you have with your health care provider. Document Released: 11/24/2001 Document Revised: 05/07/2016 Document Reviewed: 01/31/2013 Elsevier Interactive Patient Education  2017 [employer]  

## 2018-05-05 NOTE — Progress Notes (Signed)
Subjective:  Brianna Jimenez is a 32 y.o. G5P1031 at [redacted]w[redacted]d being seen today for ongoing prenatal care.  She is currently monitored for the following issues for this low-risk pregnancy and has Supervision of high risk pregnancy, antepartum; History of ectopic pregnancy; Vitamin D deficiency; and Positive GBS test on their problem list.  Patient reports no complaints.  Contractions: Irregular. Vag. Bleeding: None.  Movement: Present. Denies leaking of fluid.   The following portions of the patient's history were reviewed and updated as appropriate: allergies, current medications, past family history, past medical history, past social history, past surgical history and problem list. Problem list updated.  Objective:   Vitals:   05/05/18 0933  BP: 115/73  Pulse: 80  Weight: 165 lb 8 oz (75.1 kg)    Fetal Status: Fetal Heart Rate (bpm): 130   Movement: Present     General:  Alert, oriented and cooperative. Patient is in no acute distress.  Skin: Skin is warm and dry. No rash noted.   Cardiovascular: Normal heart rate noted  Respiratory: Normal respiratory effort, no problems with respiration noted  Abdomen: Soft, gravid, appropriate for gestational age. Pain/Pressure: Present     Pelvic:  Cervical exam performed        Extremities: Normal range of motion.  Edema: Trace  Mental Status: Normal mood and affect. Normal behavior. Normal judgment and thought content.   Urinalysis:      Assessment and Plan:  Pregnancy: [id number] at [redacted]w[redacted]d  1. Supervision of high risk pregnancy, antepartum Labor precautions - Cervicovaginal ancillary only  2. Positive GBS test Tx while in labor  Preterm labor symptoms and general obstetric precautions including but not limited to vaginal bleeding, contractions, leaking of fluid and fetal movement were reviewed in detail with the patient. Please refer to After Visit Summary for other counseling recommendations.  Return in about 1 week (around 05/12/2018)  for OB visit.   Chancy Milroy, MD

## 2018-05-06 LAB — CERVICOVAGINAL ANCILLARY ONLY
Chlamydia: NEGATIVE
NEISSERIA GONORRHEA: NEGATIVE

## 2018-05-11 ENCOUNTER — Encounter: Payer: Self-pay | Admitting: Certified Nurse Midwife

## 2018-05-11 ENCOUNTER — Ambulatory Visit (INDEPENDENT_AMBULATORY_CARE_PROVIDER_SITE_OTHER): Payer: Medicaid Other | Admitting: Certified Nurse Midwife

## 2018-05-11 VITALS — BP 126/79 | HR 83 | Wt 164.2 lb

## 2018-05-11 DIAGNOSIS — Z3480 Encounter for supervision of other normal pregnancy, unspecified trimester: Secondary | ICD-10-CM

## 2018-05-11 DIAGNOSIS — B951 Streptococcus, group B, as the cause of diseases classified elsewhere: Secondary | ICD-10-CM

## 2018-05-11 DIAGNOSIS — E559 Vitamin D deficiency, unspecified: Secondary | ICD-10-CM

## 2018-05-11 NOTE — Progress Notes (Signed)
   PRENATAL VISIT NOTE  Subjective:  Brianna Jimenez is a 32 y.o. [id number] at [redacted]w[redacted]d being seen today for ongoing prenatal care.  She is currently monitored for the following issues for this low-risk pregnancy and has Supervision of other normal pregnancy, antepartum; History of ectopic pregnancy; Vitamin D deficiency; and Positive GBS test on their problem list.  Patient reports no bleeding, no leaking and occasional contractions.  Contractions: Irregular. Vag. Bleeding: None.  Movement: Present. Denies leaking of fluid.   The following portions of the patient's history were reviewed and updated as appropriate: allergies, current medications, past family history, past medical history, past social history, past surgical history and problem list. Problem list updated.  Objective:   Vitals:   05/11/18 0905  BP: 126/79  Pulse: 83  Weight: 164 lb 3.2 oz (74.5 kg)    Fetal Status: Fetal Heart Rate (bpm): 142; doppler Fundal Height: 35 cm Movement: Present  Presentation: Vertex  General:  Alert, oriented and cooperative. Patient is in no acute distress.  Skin: Skin is warm and dry. No rash noted.   Cardiovascular: Normal heart rate noted  Respiratory: Normal respiratory effort, no problems with respiration noted  Abdomen: Soft, gravid, appropriate for gestational age.  Pain/Pressure: Present     Pelvic: Cervical exam performed Dilation: Closed Effacement (%): Thick Station: Ballotable, posterior  Extremities: Normal range of motion.  Edema: Trace  Mental Status: Normal mood and affect. Normal behavior. Normal judgment and thought content.   Assessment and Plan:  Pregnancy: [id number] at [redacted]w[redacted]d  1. Supervision of other normal pregnancy, antepartum     Doing well  2. Vitamin D deficiency      Taking weekly vitamin D  3. Positive GBS test     PCN for labor/delivery  Preterm labor symptoms and general obstetric precautions including but not limited to vaginal bleeding, contractions,  leaking of fluid and fetal movement were reviewed in detail with the patient. Please refer to After Visit Summary for other counseling recommendations.  Return in about 1 week (around 05/18/2018) for [place].  No future appointments.  Morene Crocker, CNM

## 2018-05-11 NOTE — Patient Instructions (Signed)
Before [facility] Before your baby arrives it is important to:  Have all of the supplies that you will need to care for your baby.  Know where to go if there is an emergency.  Discuss the baby's arrival with other family members.  What supplies will I need?  It is recommended that you have the following supplies: Large Items  Crib.  Crib mattress.  Rear-facing infant car seat. If possible, have a trained professional check to make sure that it is installed correctly.  Feeding  6-8 bottles that are 4-5 oz in size.  6-8 nipples.  Bottle brush.  Sterilizer, or a large pan or kettle with a lid.  A way to boil and cool water.  If you will be breastfeeding: ? Breast pump. ? Nipple cream. ? Nursing bra. ? Breast pads. ? Breast shields.  If you will be formula feeding: ? Formula. ? Measuring cups. ? Measuring spoons.  Bathing  Mild baby soap and baby shampoo.  Petroleum jelly.  Soft cloth towel and washcloth.  Hooded towel.  Cotton balls.  Bath basin.  Other Supplies  Rectal thermometer.  Bulb syringe.  Baby wipes or washcloths for diaper changes.  Diaper bag.  Changing pad.  Clothing, including one-piece outfits and pajamas.  Baby nail clippers.  Receiving blankets.  Mattress pad and sheets for the crib.  Night-light for the baby's room.  Baby monitor.  2 or 3 pacifiers.  Either 24-36 cloth diapers and waterproof diaper covers or a box of disposable diapers. You may need to use as many as 10-12 diapers per day.  How do I prepare for an emergency? Prepare for an emergency by:  Knowing how to get to the nearest hospital.  Listing the phone numbers of your baby's health care providers near your home phone and in your cell phone.  How do I prepare my family?  Decide how to handle visitors.  If you have other children: ? Talk with them about the baby coming home. Ask them how they feel about it. ? Read a book together about  being a new big brother or sister. ? Find ways to let them help you prepare for the new baby. ? Have someone ready to care for them while you are in the hospital. This information is not intended to replace advice given to you by your health care provider. Make sure you discuss any questions you have with your health care provider. Document Released: 11/12/2008 Document Revised: 05/07/2016 Document Reviewed: 11/07/2014 Elsevier Interactive Patient Education  2018 [name].  [employer] of the uterus can occur throughout pregnancy, but they are not always a sign that you are in labor. You may have practice contractions called Braxton Hicks contractions. These false labor contractions are sometimes confused with true labor. What are [place] contractions? Braxton Hicks contractions are tightening movements that occur in the muscles of the uterus before labor. Unlike true labor contractions, these contractions do not result in opening (dilation) and thinning of the cervix. Toward the end of pregnancy (32-34 weeks), Braxton Hicks contractions can happen more often and may become stronger. These contractions are sometimes difficult to tell apart from true labor because they can be very uncomfortable. You should not feel embarrassed if you go to the hospital with false labor. Sometimes, the only way to tell if you are in true labor is for your health care provider to look for changes in the cervix. The health care provider will do a  physical exam and may monitor your contractions. If you are not in true labor, the exam should show that your cervix is not dilating and your water has not broken. If there are other health problems associated with your pregnancy, it is completely safe for you to be sent home with false labor. You may continue to have Braxton Hicks contractions until you go into true labor. How to tell the difference between true labor and false labor True  labor  Contractions last 30-70 seconds.  Contractions become very regular.  Discomfort is usually felt in the top of the uterus, and it spreads to the lower abdomen and low back.  Contractions do not go away with walking.  Contractions usually become more intense and increase in frequency.  The cervix dilates and gets thinner. False labor  Contractions are usually shorter and not as strong as true labor contractions.  Contractions are usually irregular.  Contractions are often felt in the front of the lower abdomen and in the groin.  Contractions may go away when you walk around or change positions while lying down.  Contractions get weaker and are shorter-lasting as time goes on.  The cervix usually does not dilate or become thin. Follow these instructions at home:  Take over-the-counter and prescription medicines only as told by your health care provider.  Keep up with your usual exercises and follow other instructions from your health care provider.  Eat and drink lightly if you think you are going into labor.  If Braxton Hicks contractions are making you uncomfortable: ? Change your position from lying down or resting to walking, or change from walking to resting. ? Sit and rest in a tub of warm water. ? Drink enough fluid to keep your urine pale yellow. Dehydration may cause these contractions. ? Do slow and deep breathing several times an hour.  Keep all follow-up prenatal visits as told by your health care provider. This is important. Contact a health care provider if:  You have a fever.  You have continuous pain in your abdomen. Get help right away if:  Your contractions become stronger, more regular, and closer together.  You have fluid leaking or gushing from your vagina.  You pass blood-tinged mucus (bloody show).  You have bleeding from your vagina.  You have low back pain that you never had before.  You feel your baby's head pushing down and  causing pelvic pressure.  Your baby is not moving inside you as much as it used to. Summary  Contractions that occur before labor are called Braxton Hicks contractions, false labor, or practice contractions.  Braxton Hicks contractions are usually shorter, weaker, farther apart, and less regular than true labor contractions. True labor contractions usually become progressively stronger and regular and they become more frequent.  Manage discomfort from [facility] contractions by changing position, resting in a warm bath, drinking plenty of water, or practicing deep breathing. This information is not intended to replace advice given to you by your health care provider. Make sure you discuss any questions you have with your health care provider. Document Released: 04/15/2017 Document Revised: 04/15/2017 Document Reviewed: 04/15/2017 Elsevier Interactive Patient Education  2018 [name].  Vaginal Delivery Vaginal delivery means that you will give birth by pushing your baby out of your birth canal (vagina). A team of health care providers will help you before, during, and after vaginal delivery. Birth experiences are unique for every woman and every pregnancy, and birth experiences vary depending on where  you choose to give birth. What should I do to prepare for my baby's birth? Before your baby is born, it is important to talk with your health care provider about:  Your labor and delivery preferences. These may include: ? Medicines that you may be given. ? How you will manage your pain. This might include non-medical pain relief techniques or injectable pain relief such as epidural analgesia. ? How you and your baby will be monitored during labor and delivery. ? Who may be in the labor and delivery room with you. ? Your feelings about surgical delivery of your baby (cesarean delivery, or C-section) if this becomes necessary. ? Your feelings about receiving donated blood through an IV  tube (blood transfusion) if this becomes necessary.  Whether you are able: ? To take pictures or videos of the birth. ? To eat during labor and delivery. ? To move around, walk, or change positions during labor and delivery.  What to expect after your baby is born, such as: ? Whether delayed umbilical cord clamping and cutting is offered. ? Who will care for your baby right after birth. ? Medicines or tests that may be recommended for your baby. ? Whether breastfeeding is supported in your hospital or birth center. ? How long you will be in the hospital or birth center.  How any medical conditions you have may affect your baby or your labor and delivery experience.  To prepare for your baby's birth, you should also:  Attend all of your health care visits before delivery (prenatal visits) as recommended by your health care provider. This is important.  Prepare your home for your baby's arrival. Make sure that you have: ? Diapers. ? Baby clothing. ? Feeding equipment. ? Safe sleeping arrangements for you and your baby.  Install a car seat in your vehicle. Have your car seat checked by a certified car seat installer to make sure that it is installed safely.  Think about who will help you with your new baby at home for at least the first several weeks after delivery.  What can I expect when I arrive at the birth center or hospital? Once you are in labor and have been admitted into the hospital or birth center, your health care provider may:  Review your pregnancy history and any concerns you have.  Insert an IV tube into one of your veins. This is used to give you fluids and medicines.  Check your blood pressure, pulse, temperature, and heart rate (vital signs).  Check whether your bag of water (amniotic sac) has broken (ruptured).  Talk with you about your birth plan and discuss pain control options.  Monitoring Your health care provider may monitor your contractions (uterine  monitoring) and your baby's heart rate (fetal monitoring). You may need to be monitored:  Often, but not continuously (intermittently).  All the time or for long periods at a time (continuously). Continuous monitoring may be needed if: ? You are taking certain medicines, such as medicine to relieve pain or make your contractions stronger. ? You have pregnancy or labor complications.  Monitoring may be done by:  Placing a special stethoscope or a handheld monitoring device on your abdomen to check your baby's heartbeat, and feeling your abdomen for contractions. This method of monitoring does not continuously record your baby's heartbeat or your contractions.  Placing monitors on your abdomen (external monitors) to record your baby's heartbeat and the frequency and length of contractions. You may not have to wear external  monitors all the time.  Placing monitors inside of your uterus (internal monitors) to record your baby's heartbeat and the frequency, length, and strength of your contractions. ? Your health care provider may use internal monitors if he or she needs more information about the strength of your contractions or your baby's heart rate. ? Internal monitors are put in place by passing a thin, flexible wire through your vagina and into your uterus. Depending on the type of monitor, it may remain in your uterus or on your baby's head until birth. ? Your health care provider will discuss the benefits and risks of internal monitoring with you and will ask for your permission before inserting the monitors.  Telemetry. This is a type of continuous monitoring that can be done with external or internal monitors. Instead of having to stay in bed, you are able to move around during telemetry. Ask your health care provider if telemetry is an option for you.  Physical exam Your health care provider may perform a physical exam. This may include:  Checking whether your baby is  positioned: ? With the head toward your vagina (head-down). This is most common. ? With the head toward the top of your uterus (head-up or breech). If your baby is in a breech position, your health care provider may try to turn your baby to a head-down position so you can deliver vaginally. If it does not seem that your baby can be born vaginally, your provider may recommend surgery to deliver your baby. In rare cases, you may be able to deliver vaginally if your baby is head-up (breech delivery). ? Lying sideways (transverse). Babies that are lying sideways cannot be delivered vaginally.  Checking your cervix to determine: ? Whether it is thinning out (effacing). ? Whether it is opening up (dilating). ? How low your baby has moved into your birth canal.  What are the three stages of labor and delivery?  Normal labor and delivery is divided into the following three stages: Stage 1  Stage 1 is the longest stage of labor, and it can last for hours or days. Stage 1 includes: ? Early labor. This is when contractions may be irregular, or regular and mild. Generally, early labor contractions are more than 10 minutes apart. ? Active labor. This is when contractions get longer, more regular, more frequent, and more intense. ? The transition phase. This is when contractions happen very close together, are very intense, and may last longer than during any other part of labor.  Contractions generally feel mild, infrequent, and irregular at first. They get stronger, more frequent (about every 2-3 minutes), and more regular as you progress from early labor through active labor and transition.  Many women progress through stage 1 naturally, but you may need help to continue making progress. If this happens, your health care provider may talk with you about: ? Rupturing your amniotic sac if it has not ruptured yet. ? Giving you medicine to help make your contractions stronger and more frequent.  Stage 1  ends when your cervix is completely dilated to 4 inches (10 cm) and completely effaced. This happens at the end of the transition phase. Stage 2  Once your cervix is completely effaced and dilated to 4 inches (10 cm), you may start to feel an urge to push. It is common for the body to naturally take a rest before feeling the urge to push, especially if you received an epidural or certain other pain medicines. This rest  period may last for up to 1-2 hours, depending on your unique labor experience.  During stage 2, contractions are generally less painful, because pushing helps relieve contraction pain. Instead of contraction pain, you may feel stretching and burning pain, especially when the widest part of your baby's head passes through the vaginal opening (crowning).  Your health care provider will closely monitor your pushing progress and your baby's progress through the vagina during stage 2.  Your health care provider may massage the area of skin between your vaginal opening and anus (perineum) or apply warm compresses to your perineum. This helps it stretch as the baby's head starts to crown, which can help prevent perineal tearing. ? In some cases, an incision may be made in your perineum (episiotomy) to allow the baby to pass through the vaginal opening. An episiotomy helps to make the opening of the vagina larger to allow more room for the baby to fit through.  It is very important to breathe and focus so your health care provider can control the delivery of your baby's head. Your health care provider may have you decrease the intensity of your pushing, to help prevent perineal tearing.  After delivery of your baby's head, the shoulders and the rest of the body generally deliver very quickly and without difficulty.  Once your baby is delivered, the umbilical cord may be cut right away, or this may be delayed for 1-2 minutes, depending on your baby's health. This may vary among health care  providers, hospitals, and birth centers.  If you and your baby are healthy enough, your baby may be placed on your chest or abdomen to help maintain the baby's temperature and to help you bond with each other. Some mothers and babies start breastfeeding at this time. Your health care team will dry your baby and help keep your baby warm during this time.  Your baby may need immediate care if he or she: ? Showed signs of distress during labor. ? Has a medical condition. ? Was born too early (prematurely). ? Had a bowel movement before birth (meconium). ? Shows signs of difficulty transitioning from being inside the uterus to being outside of the uterus. If you are planning to breastfeed, your health care team will help you begin a feeding. Stage 3  The third stage of labor starts immediately after the birth of your baby and ends after you deliver the placenta. The placenta is an organ that develops during pregnancy to provide oxygen and nutrients to your baby in the womb.  Delivering the placenta may require some pushing, and you may have mild contractions. Breastfeeding can stimulate contractions to help you deliver the placenta.  After the placenta is delivered, your uterus should tighten (contract) and become firm. This helps to stop bleeding in your uterus. To help your uterus contract and to control bleeding, your health care provider may: ? Give you medicine by injection, through an IV tube, by mouth, or through your rectum (rectally). ? Massage your abdomen or perform a vaginal exam to remove any blood clots that are left in your uterus. ? Empty your bladder by placing a thin, flexible tube (catheter) into your bladder. ? Encourage you to breastfeed your baby. After labor is over, you and your baby will be monitored closely to ensure that you are both healthy until you are ready to go home. Your health care team will teach you how to care for yourself and your baby. This information is  not intended  to replace advice given to you by your health care provider. Make sure you discuss any questions you have with your health care provider. Document Released: 09/08/2008 Document Revised: 06/19/2016 Document Reviewed: 12/15/2015 Elsevier Interactive Patient Education  2018 [place] of Pregnancy The third trimester is from week 28 through week 40 (months 7 through 9). The third trimester is a time when the unborn baby (fetus) is growing rapidly. At the end of the ninth month, the fetus is about 20 inches in length and weighs 6-10 pounds. Body changes during your third trimester Your body will continue to go through many changes during pregnancy. The changes vary from woman to woman. During the third trimester:  Your weight will continue to increase. You can expect to gain 25-35 pounds (11-16 kg) by the end of the pregnancy.  You may begin to get stretch marks on your hips, abdomen, and breasts.  You may urinate more often because the fetus is moving lower into your pelvis and pressing on your bladder.  You may develop or continue to have heartburn. This is caused by increased hormones that slow down muscles in the digestive tract.  You may develop or continue to have constipation because increased hormones slow digestion and cause the muscles that push waste through your intestines to relax.  You may develop hemorrhoids. These are swollen veins (varicose veins) in the rectum that can itch or be painful.  You may develop swollen, bulging veins (varicose veins) in your legs.  You may have increased body aches in the pelvis, back, or thighs. This is due to weight gain and increased hormones that are relaxing your joints.  You may have changes in your hair. These can include thickening of your hair, rapid growth, and changes in texture. Some women also have hair loss during or after pregnancy, or hair that feels dry or thin. Your hair will most likely return to  normal after your baby is born.  Your breasts will continue to grow and they will continue to become tender. A yellow fluid (colostrum) may leak from your breasts. This is the first milk you are producing for your baby.  Your belly button may stick out.  You may notice more swelling in your hands, face, or ankles.  You may have increased tingling or numbness in your hands, arms, and legs. The skin on your belly may also feel numb.  You may feel short of breath because of your expanding uterus.  You may have more problems sleeping. This can be caused by the size of your belly, increased need to urinate, and an increase in your body's metabolism.  You may notice the fetus "dropping," or moving lower in your abdomen (lightening).  You may have increased vaginal discharge.  You may notice your joints feel loose and you may have pain around your pelvic bone.  What to expect at prenatal visits You will have prenatal exams every 2 weeks until week 36. Then you will have weekly prenatal exams. During a routine prenatal visit:  You will be weighed to make sure you and the baby are growing normally.  Your blood pressure will be taken.  Your abdomen will be measured to track your baby's growth.  The fetal heartbeat will be listened to.  Any test results from the previous visit will be discussed.  You may have a cervical check near your due date to see if your cervix has softened or thinned (effaced).  You will be tested for  Group B streptococcus. This happens between 35 and 37 weeks.  Your health care provider may ask you:  What your birth plan is.  How you are feeling.  If you are feeling the baby move.  If you have had any abnormal symptoms, such as leaking fluid, bleeding, severe headaches, or abdominal cramping.  If you are using any tobacco products, including cigarettes, chewing tobacco, and electronic cigarettes.  If you have any questions.  Other tests or screenings  that may be performed during your third trimester include:  Blood tests that check for low iron levels (anemia).  Fetal testing to check the health, activity level, and growth of the fetus. Testing is done if you have certain medical conditions or if there are problems during the pregnancy.  Nonstress test (NST). This test checks the health of your baby to make sure there are no signs of problems, such as the baby not getting enough oxygen. During this test, a belt is placed around your belly. The baby is made to move, and its heart rate is monitored during movement.  What is false labor? False labor is a condition in which you feel small, irregular tightenings of the muscles in the womb (contractions) that usually go away with rest, changing position, or drinking water. These are called Braxton Hicks contractions. Contractions may last for hours, days, or even weeks before true labor sets in. If contractions come at regular intervals, become more frequent, increase in intensity, or become painful, you should see your health care provider. What are the signs of labor?  Abdominal cramps.  Regular contractions that start at 10 minutes apart and become stronger and more frequent with time.  Contractions that start on the top of the uterus and spread down to the lower abdomen and back.  Increased pelvic pressure and dull back pain.  A watery or bloody mucus discharge that comes from the vagina.  Leaking of amniotic fluid. This is also known as your "water breaking." It could be a slow trickle or a gush. Let your health care provider know if it has a color or strange odor. If you have any of these signs, call your health care provider right away, even if it is before your due date. Follow these instructions at home: Medicines  Follow your health care provider's instructions regarding medicine use. Specific medicines may be either safe or unsafe to take during pregnancy.  Take a prenatal  vitamin that contains at least 600 micrograms (mcg) of folic acid.  If you develop constipation, try taking a stool softener if your health care provider approves. Eating and drinking  Eat a balanced diet that includes fresh fruits and vegetables, whole grains, good sources of protein such as meat, eggs, or tofu, and low-fat dairy. Your health care provider will help you determine the amount of weight gain that is right for you.  Avoid raw meat and uncooked cheese. These carry germs that can cause birth defects in the baby.  If you have low calcium intake from food, talk to your health care provider about whether you should take a daily calcium supplement.  Eat four or five small meals rather than three large meals a day.  Limit foods that are high in fat and processed sugars, such as fried and sweet foods.  To prevent constipation: ? Drink enough fluid to keep your urine clear or pale yellow. ? Eat foods that are high in fiber, such as fresh fruits and vegetables, whole grains, and beans. Activity  Exercise only as directed by your health care provider. Most women can continue their usual exercise routine during pregnancy. Try to exercise for 30 minutes at least 5 days a week. Stop exercising if you experience uterine contractions.  Avoid heavy lifting.  Do not exercise in extreme heat or humidity, or at high altitudes.  Wear low-heel, comfortable shoes.  Practice good posture.  You may continue to have sex unless your health care provider tells you otherwise. Relieving pain and discomfort  Take frequent breaks and rest with your legs elevated if you have leg cramps or low back pain.  Take warm sitz baths to soothe any pain or discomfort caused by hemorrhoids. Use hemorrhoid cream if your health care provider approves.  Wear a good support bra to prevent discomfort from breast tenderness.  If you develop varicose veins: ? Wear support pantyhose or compression stockings as  told by your healthcare provider. ? Elevate your feet for 15 minutes, 3-4 times a day. Prenatal care  Write down your questions. Take them to your prenatal visits.  Keep all your prenatal visits as told by your health care provider. This is important. Safety  Wear your seat belt at all times when driving.  Make a list of emergency phone numbers, including numbers for family, friends, the hospital, and police and fire departments. General instructions  Avoid cat litter boxes and soil used by cats. These carry germs that can cause birth defects in the baby. If you have a cat, ask someone to clean the litter box for you.  Do not travel far distances unless it is absolutely necessary and only with the approval of your health care provider.  Do not use hot tubs, steam rooms, or saunas.  Do not drink alcohol.  Do not use any products that contain nicotine or tobacco, such as cigarettes and e-cigarettes. If you need help quitting, ask your health care provider.  Do not use any medicinal herbs or unprescribed drugs. These chemicals affect the formation and growth of the baby.  Do not douche or use tampons or scented sanitary pads.  Do not cross your legs for long periods of time.  To prepare for the arrival of your baby: ? Take prenatal classes to understand, practice, and ask questions about labor and delivery. ? Make a trial run to the hospital. ? Visit the hospital and tour the maternity area. ? Arrange for maternity or paternity leave through employers. ? Arrange for family and friends to take care of pets while you are in the hospital. ? Purchase a rear-facing car seat and make sure you know how to install it in your car. ? Pack your hospital bag. ? Prepare the baby's nursery. Make sure to remove all pillows and stuffed animals from the baby's crib to prevent suffocation.  Visit your dentist if you have not gone during your pregnancy. Use a soft toothbrush to brush your teeth and  be gentle when you floss. Contact a health care provider if:  You are unsure if you are in labor or if your water has broken.  You become dizzy.  You have mild pelvic cramps, pelvic pressure, or nagging pain in your abdominal area.  You have lower back pain.  You have persistent nausea, vomiting, or diarrhea.  You have an unusual or bad smelling vaginal discharge.  You have pain when you urinate. Get help right away if:  Your water breaks before 37 weeks.  You have regular contractions less than 5 minutes apart before  37 weeks.  You have a fever.  You are leaking fluid from your vagina.  You have spotting or bleeding from your vagina.  You have severe abdominal pain or cramping.  You have rapid weight loss or weight gain.  You have shortness of breath with chest pain.  You notice sudden or extreme swelling of your face, hands, ankles, feet, or legs.  Your baby makes fewer than 10 movements in 2 hours.  You have severe headaches that do not go away when you take medicine.  You have vision changes. Summary  The third trimester is from week 28 through week 40, months 7 through 9. The third trimester is a time when the unborn baby (fetus) is growing rapidly.  During the third trimester, your discomfort may increase as you and your baby continue to gain weight. You may have abdominal, leg, and back pain, sleeping problems, and an increased need to urinate.  During the third trimester your breasts will keep growing and they will continue to become tender. A yellow fluid (colostrum) may leak from your breasts. This is the first milk you are producing for your baby.  False labor is a condition in which you feel small, irregular tightenings of the muscles in the womb (contractions) that eventually go away. These are called Braxton Hicks contractions. Contractions may last for hours, days, or even weeks before true labor sets in.  Signs of labor can include: abdominal cramps;  regular contractions that start at 10 minutes apart and become stronger and more frequent with time; watery or bloody mucus discharge that comes from the vagina; increased pelvic pressure and dull back pain; and leaking of amniotic fluid. This information is not intended to replace advice given to you by your health care provider. Make sure you discuss any questions you have with your health care provider. Document Released: 11/24/2001 Document Revised: 05/07/2016 Document Reviewed: 01/31/2013 Elsevier Interactive Patient Education  2017 [name].

## 2018-05-18 ENCOUNTER — Encounter: Payer: Medicaid Other | Admitting: Obstetrics and Gynecology

## 2018-05-23 ENCOUNTER — Encounter: Payer: Self-pay | Admitting: Obstetrics & Gynecology

## 2018-05-23 ENCOUNTER — Other Ambulatory Visit: Payer: Self-pay

## 2018-05-23 ENCOUNTER — Ambulatory Visit (INDEPENDENT_AMBULATORY_CARE_PROVIDER_SITE_OTHER): Payer: Medicaid Other | Admitting: Obstetrics & Gynecology

## 2018-05-23 VITALS — BP 131/78 | HR 77 | Wt 166.3 lb

## 2018-05-23 DIAGNOSIS — Z3480 Encounter for supervision of other normal pregnancy, unspecified trimester: Secondary | ICD-10-CM

## 2018-05-23 DIAGNOSIS — Z3483 Encounter for supervision of other normal pregnancy, third trimester: Secondary | ICD-10-CM

## 2018-05-23 NOTE — Progress Notes (Signed)
   PRENATAL VISIT NOTE  Subjective:  Brianna Jimenez is a 32 y.o. [id number] at [redacted]w[redacted]d being seen today for ongoing prenatal care.  She is currently monitored for the following issues for this low-risk pregnancy and has Supervision of other normal pregnancy, antepartum; History of ectopic pregnancy; Vitamin D deficiency; and Positive GBS test on their problem list.  Patient reports occasional contractions.  Contractions: Irritability. Vag. Bleeding: None.  Movement: Present. Denies leaking of fluid.   The following portions of the patient's history were reviewed and updated as appropriate: allergies, current medications, past family history, past medical history, past social history, past surgical history and problem list. Problem list updated.  Objective:   Vitals:   05/23/18 1549  BP: 131/78  Pulse: 77  Weight: 166 lb 4.8 oz (75.4 kg)    Fetal Status: Fetal Heart Rate (bpm): 130 Fundal Height: 37 cm Movement: Present     General:  Alert, oriented and cooperative. Patient is in no acute distress.  Skin: Skin is warm and dry. No rash noted.   Cardiovascular: Normal heart rate noted  Respiratory: Normal respiratory effort, no problems with respiration noted  Abdomen: Soft, gravid, appropriate for gestational age.  Pain/Pressure: Present     Pelvic: Cervical exam performed Dilation: Closed Effacement (%): 40 Station: Ballotable  Extremities: Normal range of motion.  Edema: Trace  Mental Status: Normal mood and affect. Normal behavior. Normal judgment and thought content.  Doing well Assessment and Plan:  Pregnancy: [id number] at [redacted]w[redacted]d  1. Supervision of other normal pregnancy, antepartum Labor precautions given  Term labor symptoms and general obstetric precautions including but not limited to vaginal bleeding, contractions, leaking of fluid and fetal movement were reviewed in detail with the patient. Please refer to After Visit Summary for other counseling recommendations.  Return in  about 1 week (around 05/30/2018).  No future appointments.  Emeterio Reeve, MD

## 2018-05-23 NOTE — Patient Instructions (Signed)
Vaginal Delivery Vaginal delivery means that you will give birth by pushing your baby out of your birth canal (vagina). A team of health care providers will help you before, during, and after vaginal delivery. Birth experiences are unique for every woman and every pregnancy, and birth experiences vary depending on where you choose to give birth. What should I do to prepare for my baby's birth? Before your baby is born, it is important to talk with your health care provider about:  Your labor and delivery preferences. These may include: ? Medicines that you may be given. ? How you will manage your pain. This might include non-medical pain relief techniques or injectable pain relief such as epidural analgesia. ? How you and your baby will be monitored during labor and delivery. ? Who may be in the labor and delivery room with you. ? Your feelings about surgical delivery of your baby (cesarean delivery, or C-section) if this becomes necessary. ? Your feelings about receiving donated blood through an IV tube (blood transfusion) if this becomes necessary.  Whether you are able: ? To take pictures or videos of the birth. ? To eat during labor and delivery. ? To move around, walk, or change positions during labor and delivery.  What to expect after your baby is born, such as: ? Whether delayed umbilical cord clamping and cutting is offered. ? Who will care for your baby right after birth. ? Medicines or tests that may be recommended for your baby. ? Whether breastfeeding is supported in your hospital or birth center. ? How long you will be in the hospital or birth center.  How any medical conditions you have may affect your baby or your labor and delivery experience.  To prepare for your baby's birth, you should also:  Attend all of your health care visits before delivery (prenatal visits) as recommended by your health care provider. This is important.  Prepare your home for your baby's  arrival. Make sure that you have: ? Diapers. ? Baby clothing. ? Feeding equipment. ? Safe sleeping arrangements for you and your baby.  Install a car seat in your vehicle. Have your car seat checked by a certified car seat installer to make sure that it is installed safely.  Think about who will help you with your new baby at home for at least the first several weeks after delivery.  What can I expect when I arrive at the birth center or hospital? Once you are in labor and have been admitted into the hospital or birth center, your health care provider may:  Review your pregnancy history and any concerns you have.  Insert an IV tube into one of your veins. This is used to give you fluids and medicines.  Check your blood pressure, pulse, temperature, and heart rate (vital signs).  Check whether your bag of water (amniotic sac) has broken (ruptured).  Talk with you about your birth plan and discuss pain control options.  Monitoring Your health care provider may monitor your contractions (uterine monitoring) and your baby's heart rate (fetal monitoring). You may need to be monitored:  Often, but not continuously (intermittently).  All the time or for long periods at a time (continuously). Continuous monitoring may be needed if: ? You are taking certain medicines, such as medicine to relieve pain or make your contractions stronger. ? You have pregnancy or labor complications.  Monitoring may be done by:  Placing a special stethoscope or a handheld monitoring device on your abdomen to   check your baby's heartbeat, and feeling your abdomen for contractions. This method of monitoring does not continuously record your baby's heartbeat or your contractions.  Placing monitors on your abdomen (external monitors) to record your baby's heartbeat and the frequency and length of contractions. You may not have to wear external monitors all the time.  Placing monitors inside of your uterus  (internal monitors) to record your baby's heartbeat and the frequency, length, and strength of your contractions. ? Your health care provider may use internal monitors if he or she needs more information about the strength of your contractions or your baby's heart rate. ? Internal monitors are put in place by passing a thin, flexible wire through your vagina and into your uterus. Depending on the type of monitor, it may remain in your uterus or on your baby's head until birth. ? Your health care provider will discuss the benefits and risks of internal monitoring with you and will ask for your permission before inserting the monitors.  Telemetry. This is a type of continuous monitoring that can be done with external or internal monitors. Instead of having to stay in bed, you are able to move around during telemetry. Ask your health care provider if telemetry is an option for you.  Physical exam Your health care provider may perform a physical exam. This may include:  Checking whether your baby is positioned: ? With the head toward your vagina (head-down). This is most common. ? With the head toward the top of your uterus (head-up or breech). If your baby is in a breech position, your health care provider may try to turn your baby to a head-down position so you can deliver vaginally. If it does not seem that your baby can be born vaginally, your provider may recommend surgery to deliver your baby. In rare cases, you may be able to deliver vaginally if your baby is head-up (breech delivery). ? Lying sideways (transverse). Babies that are lying sideways cannot be delivered vaginally.  Checking your cervix to determine: ? Whether it is thinning out (effacing). ? Whether it is opening up (dilating). ? How low your baby has moved into your birth canal.  What are the three stages of labor and delivery?  Normal labor and delivery is divided into the following three stages: Stage 1  Stage 1 is the  longest stage of labor, and it can last for hours or days. Stage 1 includes: ? Early labor. This is when contractions may be irregular, or regular and mild. Generally, early labor contractions are more than 10 minutes apart. ? Active labor. This is when contractions get longer, more regular, more frequent, and more intense. ? The transition phase. This is when contractions happen very close together, are very intense, and may last longer than during any other part of labor.  Contractions generally feel mild, infrequent, and irregular at first. They get stronger, more frequent (about every 2-3 minutes), and more regular as you progress from early labor through active labor and transition.  Many women progress through stage 1 naturally, but you may need help to continue making progress. If this happens, your health care provider may talk with you about: ? Rupturing your amniotic sac if it has not ruptured yet. ? Giving you medicine to help make your contractions stronger and more frequent.  Stage 1 ends when your cervix is completely dilated to 4 inches (10 cm) and completely effaced. This happens at the end of the transition phase. Stage 2  Once   your cervix is completely effaced and dilated to 4 inches (10 cm), you may start to feel an urge to push. It is common for the body to naturally take a rest before feeling the urge to push, especially if you received an epidural or certain other pain medicines. This rest period may last for up to 1-2 hours, depending on your unique labor experience.  During stage 2, contractions are generally less painful, because pushing helps relieve contraction pain. Instead of contraction pain, you may feel stretching and burning pain, especially when the widest part of your baby's head passes through the vaginal opening (crowning).  Your health care provider will closely monitor your pushing progress and your baby's progress through the vagina during stage 2.  Your  health care provider may massage the area of skin between your vaginal opening and anus (perineum) or apply warm compresses to your perineum. This helps it stretch as the baby's head starts to crown, which can help prevent perineal tearing. ? In some cases, an incision may be made in your perineum (episiotomy) to allow the baby to pass through the vaginal opening. An episiotomy helps to make the opening of the vagina larger to allow more room for the baby to fit through.  It is very important to breathe and focus so your health care provider can control the delivery of your baby's head. Your health care provider may have you decrease the intensity of your pushing, to help prevent perineal tearing.  After delivery of your baby's head, the shoulders and the rest of the body generally deliver very quickly and without difficulty.  Once your baby is delivered, the umbilical cord may be cut right away, or this may be delayed for 1-2 minutes, depending on your baby's health. This may vary among health care providers, hospitals, and birth centers.  If you and your baby are healthy enough, your baby may be placed on your chest or abdomen to help maintain the baby's temperature and to help you bond with each other. Some mothers and babies start breastfeeding at this time. Your health care team will dry your baby and help keep your baby warm during this time.  Your baby may need immediate care if he or she: ? Showed signs of distress during labor. ? Has a medical condition. ? Was born too early (prematurely). ? Had a bowel movement before birth (meconium). ? Shows signs of difficulty transitioning from being inside the uterus to being outside of the uterus. If you are planning to breastfeed, your health care team will help you begin a feeding. Stage 3  The third stage of labor starts immediately after the birth of your baby and ends after you deliver the placenta. The placenta is an organ that develops  during pregnancy to provide oxygen and nutrients to your baby in the womb.  Delivering the placenta may require some pushing, and you may have mild contractions. Breastfeeding can stimulate contractions to help you deliver the placenta.  After the placenta is delivered, your uterus should tighten (contract) and become firm. This helps to stop bleeding in your uterus. To help your uterus contract and to control bleeding, your health care provider may: ? Give you medicine by injection, through an IV tube, by mouth, or through your rectum (rectally). ? Massage your abdomen or perform a vaginal exam to remove any blood clots that are left in your uterus. ? Empty your bladder by placing a thin, flexible tube (catheter) into your bladder. ? Encourage   you to breastfeed your baby. After labor is over, you and your baby will be monitored closely to ensure that you are both healthy until you are ready to go home. Your health care team will teach you how to care for yourself and your baby. This information is not intended to replace advice given to you by your health care provider. Make sure you discuss any questions you have with your health care provider. Document Released: 09/08/2008 Document Revised: 06/19/2016 Document Reviewed: 12/15/2015 Elsevier Interactive Patient Education  2018 [employer]  

## 2018-05-31 ENCOUNTER — Other Ambulatory Visit: Payer: Self-pay

## 2018-05-31 ENCOUNTER — Inpatient Hospital Stay (EMERGENCY_DEPARTMENT_HOSPITAL)
Admission: AD | Admit: 2018-05-31 | Discharge: 2018-05-31 | Disposition: A | Discharge status: Home / Self Care | Payer: Medicaid Other | Source: Ambulatory Visit | Attending: Obstetrics & Gynecology | Admitting: Obstetrics & Gynecology

## 2018-05-31 ENCOUNTER — Encounter (HOSPITAL_COMMUNITY): Payer: Self-pay

## 2018-05-31 DIAGNOSIS — O219 Vomiting of pregnancy, unspecified: Secondary | ICD-10-CM | POA: Diagnosis not present

## 2018-05-31 DIAGNOSIS — Z3A39 39 weeks gestation of pregnancy: Secondary | ICD-10-CM | POA: Insufficient documentation

## 2018-05-31 DIAGNOSIS — O479 False labor, unspecified: Secondary | ICD-10-CM | POA: Diagnosis not present

## 2018-05-31 MED ORDER — PROMETHAZINE HCL 25 MG/ML IJ SOLN
12.5000 mg | Freq: Once | INTRAMUSCULAR | Status: AC
  Administered 2018-05-31: 12.5 mg via INTRAMUSCULAR
  Filled 2018-05-31: qty 1

## 2018-05-31 MED ORDER — NALBUPHINE HCL 10 MG/ML IJ SOLN
10.0000 mg | Freq: Once | INTRAMUSCULAR | Status: AC
  Administered 2018-05-31: 10 mg via INTRAMUSCULAR
  Filled 2018-05-31: qty 1

## 2018-05-31 NOTE — Progress Notes (Addendum)
G5P1 @ 39.[redacted] wksga. Here for ctx that started yesterday at 0300. Denies LOF or bleeding. +FM  EFM applied byRN Lynette including VE.   Informed pt POC and aware cervical exam in an hr. Will cont to monitor.   1118: Ctx q1.5-3.5 strong on palpitation. FHR 130 with 15x15 accels and good variability. Pt breathing through ctx.   1120: icechips given.   Pt cervix rechecked by above nurse. No change and provider aware.  1237: medicated per order. EFM adjusted.   Pt resting quietly with eyes closed and snoring.  1421: d/c instructions given with pt understanding. Pt left unit via ambulatory with SO

## 2018-05-31 NOTE — Discharge Instructions (Signed)
Reasons to return to [facility]:  1.  Contractions are  5 minutes apart or less, each last 1 minute, these have been going on for 1-2 hours, and you cannot walk or talk during them 2.  You have a large gush of fluid, or a trickle of fluid that will not stop and you have to wear a pad 3.  You have bleeding that is bright red, heavier than spotting--like menstrual bleeding (spotting can be normal in early labor or after a check of your cervix) 4.  You do not feel the baby moving like he/she normally does    [name] Contractions of the uterus can occur throughout pregnancy, but they are not always a sign that you are in labor. You may have practice contractions called Braxton Hicks contractions. These false labor contractions are sometimes confused with true labor. What are Brianna Jimenez contractions? Braxton Hicks contractions are tightening movements that occur in the muscles of the uterus before labor. Unlike true labor contractions, these contractions do not result in opening (dilation) and thinning of the cervix. Toward the end of pregnancy (32-34 weeks), Braxton Hicks contractions can happen more often and may become stronger. These contractions are sometimes difficult to tell apart from true labor because they can be very uncomfortable. You should not feel embarrassed if you go to the hospital with false labor. Sometimes, the only way to tell if you are in true labor is for your health care provider to look for changes in the cervix. The health care provider will do a physical exam and may monitor your contractions. If you are not in true labor, the exam should show that your cervix is not dilating and your water has not broken. If there are other health problems associated with your pregnancy, it is completely safe for you to be sent home with false labor. You may continue to have Braxton Hicks contractions until you go into true labor. How to tell the difference between true labor  and false labor True labor  Contractions last 30-70 seconds.  Contractions become very regular.  Discomfort is usually felt in the top of the uterus, and it spreads to the lower abdomen and low back.  Contractions do not go away with walking.  Contractions usually become more intense and increase in frequency.  The cervix dilates and gets thinner. False labor  Contractions are usually shorter and not as strong as true labor contractions.  Contractions are usually irregular.  Contractions are often felt in the front of the lower abdomen and in the groin.  Contractions may go away when you walk around or change positions while lying down.  Contractions get weaker and are shorter-lasting as time goes on.  The cervix usually does not dilate or become thin. Follow these instructions at home:  Take over-the-counter and prescription medicines only as told by your health care provider.  Keep up with your usual exercises and follow other instructions from your health care provider.  Eat and drink lightly if you think you are going into labor.  If Braxton Hicks contractions are making you uncomfortable: ? Change your position from lying down or resting to walking, or change from walking to resting. ? Sit and rest in a tub of warm water. ? Drink enough fluid to keep your urine pale yellow. Dehydration may cause these contractions. ? Do slow and deep breathing several times an hour.  Keep all follow-up prenatal visits as told by your health care provider. This is important.  Contact a health care provider if:  You have a fever.  You have continuous pain in your abdomen. Get help right away if:  Your contractions become stronger, more regular, and closer together.  You have fluid leaking or gushing from your vagina.  You pass blood-tinged mucus (bloody show).  You have bleeding from your vagina.  You have low back pain that you never had before.  You feel your babys head  pushing down and causing pelvic pressure.  Your baby is not moving inside you as much as it used to. Summary  Contractions that occur before labor are called Braxton Hicks contractions, false labor, or practice contractions.  Braxton Hicks contractions are usually shorter, weaker, farther apart, and less regular than true labor contractions. True labor contractions usually become progressively stronger and regular and they become more frequent.  Manage discomfort from [name] contractions by changing position, resting in a warm bath, drinking plenty of water, or practicing deep breathing. This information is not intended to replace advice given to you by your health care provider. Make sure you discuss any questions you have with your health care provider. Document Released: 04/15/2017 Document Revised: 04/15/2017 Document Reviewed: 04/15/2017 Elsevier Interactive Patient Education  2018 [name].

## 2018-05-31 NOTE — MAU Provider Note (Signed)
LABOR EVALUATION PROVIDER NOTE:   Patient presents to MAU with complaints of contractions. She reports contractions began at 0300 yesterday. She reports contractions are every 3-4 minutes and breathing through some contractions. She reports + fetal movement and denies LOF or VB.  Cervix checked by RN: Dilation: 2.5 Effacement (%): 50 Cervical Position: Middle Station: -2 Presentation: Vertex Exam by:: weston,rn  No cervical change on reassessment after 1.5 hours   FHR: 130/ moderate variability/ +accelerations/ no decelerations  Toco: 2-4  Patient reports abdominal pain 7/10 and requesting pain medication and nausea medication. Nubain and Phenergan IM given in MAU prior to discharge home. Patient comfortable with discharge home. Labor precautions reviewed with patient.   Lajean Manes, CNM 05/31/18, 1:46 PM

## 2018-06-01 ENCOUNTER — Inpatient Hospital Stay (HOSPITAL_COMMUNITY): Payer: Medicaid Other | Admitting: Anesthesiology

## 2018-06-01 ENCOUNTER — Encounter: Payer: Medicaid Other | Admitting: Obstetrics and Gynecology

## 2018-06-01 ENCOUNTER — Inpatient Hospital Stay (HOSPITAL_COMMUNITY)
Admission: AD | Admit: 2018-06-01 | Discharge: 2018-06-03 | DRG: 807 | Disposition: A | Discharge status: Home / Self Care | Payer: Medicaid Other | Attending: Family Medicine | Admitting: Family Medicine

## 2018-06-01 ENCOUNTER — Encounter (HOSPITAL_COMMUNITY): Payer: Self-pay | Admitting: *Deleted

## 2018-06-01 DIAGNOSIS — O99334 Smoking (tobacco) complicating childbirth: Secondary | ICD-10-CM | POA: Diagnosis present

## 2018-06-01 DIAGNOSIS — Z3A39 39 weeks gestation of pregnancy: Secondary | ICD-10-CM

## 2018-06-01 DIAGNOSIS — F17210 Nicotine dependence, cigarettes, uncomplicated: Secondary | ICD-10-CM | POA: Diagnosis present

## 2018-06-01 DIAGNOSIS — O99824 Streptococcus B carrier state complicating childbirth: Principal | ICD-10-CM | POA: Diagnosis present

## 2018-06-01 DIAGNOSIS — Z3483 Encounter for supervision of other normal pregnancy, third trimester: Secondary | ICD-10-CM | POA: Diagnosis present

## 2018-06-01 LAB — CBC
HCT: 40.7 % (ref 36.0–46.0)
HEMOGLOBIN: 12.8 g/dL (ref 12.0–15.0)
MCH: 23.5 pg — AB (ref 26.0–34.0)
MCHC: 31.4 g/dL (ref 30.0–36.0)
MCV: 74.7 fL — AB (ref 78.0–100.0)
Platelets: 242 10*3/uL (ref 150–400)
RBC: 5.45 MIL/uL — AB (ref 3.87–5.11)
RDW: 16.3 % — ABNORMAL HIGH (ref 11.5–15.5)
WBC: 14.8 10*3/uL — ABNORMAL HIGH (ref 4.0–10.5)

## 2018-06-01 LAB — TYPE AND SCREEN
ABO/RH(D): A POS
Antibody Screen: NEGATIVE

## 2018-06-01 LAB — RPR: RPR Ser Ql: NONREACTIVE

## 2018-06-01 LAB — POCT FERN TEST: POCT Fern Test: POSITIVE

## 2018-06-01 MED ORDER — ONDANSETRON HCL 4 MG/2ML IJ SOLN: 4.0000 mg | Freq: Four times a day (QID) | INTRAMUSCULAR | Status: DC | PRN

## 2018-06-01 MED ORDER — SODIUM CHLORIDE 0.9 % IV SOLN
2.0000 g | Freq: Four times a day (QID) | INTRAVENOUS | Status: DC
  Administered 2018-06-01: 2 g via INTRAVENOUS
  Filled 2018-06-01: qty 2
  Filled 2018-06-01: qty 2000

## 2018-06-01 MED ORDER — SODIUM CHLORIDE 0.9% FLUSH: 3.0000 mL | Freq: Two times a day (BID) | INTRAVENOUS | Status: DC

## 2018-06-01 MED ORDER — DIBUCAINE 1 % RE OINT
1.0000 "application " | TOPICAL_OINTMENT | RECTAL | Status: DC | PRN
  Administered 2018-06-01: 1 via RECTAL
  Filled 2018-06-01: qty 28

## 2018-06-01 MED ORDER — ZOLPIDEM TARTRATE 5 MG PO TABS: 5.0000 mg | ORAL_TABLET | Freq: Every evening | ORAL | Status: DC | PRN

## 2018-06-01 MED ORDER — FLEET ENEMA 7-19 GM/118ML RE ENEM: 1.0000 | ENEMA | Freq: Every day | RECTAL | Status: DC | PRN

## 2018-06-01 MED ORDER — ONDANSETRON HCL 4 MG PO TABS: 4.0000 mg | ORAL_TABLET | ORAL | Status: DC | PRN

## 2018-06-01 MED ORDER — OXYTOCIN 40 UNITS IN LACTATED RINGERS INFUSION - SIMPLE MED
2.5000 [IU]/h | INTRAVENOUS | Status: DC
  Filled 2018-06-01: qty 1000

## 2018-06-01 MED ORDER — TETANUS-DIPHTH-ACELL PERTUSSIS 5-2.5-18.5 LF-MCG/0.5 IM SUSP: 0.5000 mL | Freq: Once | INTRAMUSCULAR | Status: DC

## 2018-06-01 MED ORDER — WITCH HAZEL-GLYCERIN EX PADS
1.0000 "application " | MEDICATED_PAD | CUTANEOUS | Status: DC | PRN
  Administered 2018-06-01: 1 via TOPICAL

## 2018-06-01 MED ORDER — IBUPROFEN 600 MG PO TABS
600.0000 mg | ORAL_TABLET | Freq: Four times a day (QID) | ORAL | Status: DC
  Administered 2018-06-01 – 2018-06-03 (×8): 600 mg via ORAL
  Filled 2018-06-01 (×9): qty 1

## 2018-06-01 MED ORDER — ACETAMINOPHEN 325 MG PO TABS
650.0000 mg | ORAL_TABLET | ORAL | Status: DC | PRN
  Administered 2018-06-02 – 2018-06-03 (×3): 650 mg via ORAL
  Filled 2018-06-01 (×3): qty 2

## 2018-06-01 MED ORDER — EPHEDRINE 5 MG/ML INJ
10.0000 mg | INTRAVENOUS | Status: DC | PRN
  Filled 2018-06-01: qty 2

## 2018-06-01 MED ORDER — ONDANSETRON HCL 4 MG/2ML IJ SOLN: 4.0000 mg | INTRAMUSCULAR | Status: DC | PRN

## 2018-06-01 MED ORDER — OXYTOCIN BOLUS FROM INFUSION
500.0000 mL | Freq: Once | INTRAVENOUS | Status: AC
  Administered 2018-06-01: 500 mL via INTRAVENOUS

## 2018-06-01 MED ORDER — SODIUM CHLORIDE 0.9 % IV SOLN: 250.0000 mL | INTRAVENOUS | Status: DC | PRN

## 2018-06-01 MED ORDER — FENTANYL 2.5 MCG/ML BUPIVACAINE 1/10 % EPIDURAL INFUSION (WH - ANES)
14.0000 mL/h | INTRAMUSCULAR | Status: DC | PRN
  Filled 2018-06-01: qty 100

## 2018-06-01 MED ORDER — LIDOCAINE HCL (PF) 1 % IJ SOLN
30.0000 mL | INTRAMUSCULAR | Status: DC | PRN
  Filled 2018-06-01: qty 30

## 2018-06-01 MED ORDER — SODIUM CHLORIDE 0.9% FLUSH: 3.0000 mL | INTRAVENOUS | Status: DC | PRN

## 2018-06-01 MED ORDER — MEASLES, MUMPS & RUBELLA VAC ~~LOC~~ INJ: 0.5000 mL | INJECTION | Freq: Once | SUBCUTANEOUS | Status: DC

## 2018-06-01 MED ORDER — PRENATAL MULTIVITAMIN CH
1.0000 | ORAL_TABLET | Freq: Every day | ORAL | Status: DC
  Administered 2018-06-01 – 2018-06-02 (×2): 1 via ORAL
  Filled 2018-06-01 (×2): qty 1

## 2018-06-01 MED ORDER — DIPHENHYDRAMINE HCL 25 MG PO CAPS: 25.0000 mg | ORAL_CAPSULE | Freq: Four times a day (QID) | ORAL | Status: DC | PRN

## 2018-06-01 MED ORDER — LACTATED RINGERS IV SOLN
INTRAVENOUS | Status: DC
  Administered 2018-06-01: 125 mL via INTRAVENOUS

## 2018-06-01 MED ORDER — PHENYLEPHRINE 40 MCG/ML (10ML) SYRINGE FOR IV PUSH (FOR BLOOD PRESSURE SUPPORT)
80.0000 ug | PREFILLED_SYRINGE | INTRAVENOUS | Status: DC | PRN
  Filled 2018-06-01: qty 5
  Filled 2018-06-01: qty 10

## 2018-06-01 MED ORDER — ACETAMINOPHEN 325 MG PO TABS: 650.0000 mg | ORAL_TABLET | ORAL | Status: DC | PRN

## 2018-06-01 MED ORDER — SENNOSIDES-DOCUSATE SODIUM 8.6-50 MG PO TABS
2.0000 | ORAL_TABLET | ORAL | Status: DC
  Administered 2018-06-01 – 2018-06-02 (×2): 2 via ORAL
  Filled 2018-06-01 (×2): qty 2

## 2018-06-01 MED ORDER — FENTANYL 2.5 MCG/ML BUPIVACAINE 1/10 % EPIDURAL INFUSION (WH - ANES)
14.0000 mL/h | INTRAMUSCULAR | Status: DC | PRN
  Administered 2018-06-01 (×2): 14 mL/h via EPIDURAL

## 2018-06-01 MED ORDER — LACTATED RINGERS IV SOLN: 500.0000 mL | INTRAVENOUS | Status: DC | PRN

## 2018-06-01 MED ORDER — OXYCODONE-ACETAMINOPHEN 5-325 MG PO TABS: 2.0000 | ORAL_TABLET | ORAL | Status: DC | PRN

## 2018-06-01 MED ORDER — SIMETHICONE 80 MG PO CHEW: 80.0000 mg | CHEWABLE_TABLET | ORAL | Status: DC | PRN

## 2018-06-01 MED ORDER — PHENYLEPHRINE 40 MCG/ML (10ML) SYRINGE FOR IV PUSH (FOR BLOOD PRESSURE SUPPORT)
80.0000 ug | PREFILLED_SYRINGE | INTRAVENOUS | Status: DC | PRN
  Filled 2018-06-01: qty 10
  Filled 2018-06-01: qty 5

## 2018-06-01 MED ORDER — BENZOCAINE-MENTHOL 20-0.5 % EX AERO
1.0000 "application " | INHALATION_SPRAY | CUTANEOUS | Status: DC | PRN
  Administered 2018-06-01: 1 via TOPICAL
  Filled 2018-06-01: qty 56

## 2018-06-01 MED ORDER — LIDOCAINE HCL (PF) 1 % IJ SOLN
INTRAMUSCULAR | Status: DC | PRN
  Administered 2018-06-01 (×2): 4 mL via EPIDURAL

## 2018-06-01 MED ORDER — OXYCODONE-ACETAMINOPHEN 5-325 MG PO TABS: 1.0000 | ORAL_TABLET | ORAL | Status: DC | PRN

## 2018-06-01 MED ORDER — DIPHENHYDRAMINE HCL 50 MG/ML IJ SOLN: 12.5000 mg | INTRAMUSCULAR | Status: DC | PRN

## 2018-06-01 MED ORDER — SOD CITRATE-CITRIC ACID 500-334 MG/5ML PO SOLN: 30.0000 mL | ORAL | Status: DC | PRN

## 2018-06-01 MED ORDER — COCONUT OIL OIL: 1.0000 "application " | TOPICAL_OIL | Status: DC | PRN

## 2018-06-01 MED ORDER — LACTATED RINGERS IV SOLN
500.0000 mL | Freq: Once | INTRAVENOUS | Status: AC
  Administered 2018-06-01: 500 mL via INTRAVENOUS

## 2018-06-01 MED ORDER — BISACODYL 10 MG RE SUPP: 10.0000 mg | Freq: Every day | RECTAL | Status: DC | PRN

## 2018-06-01 NOTE — Anesthesia Preprocedure Evaluation (Signed)
Anesthesia Evaluation  Patient identified by MRN, date of birth, ID band Patient awake    Reviewed: Allergy & Precautions, Patient's Chart, lab work & pertinent test results  Airway Mallampati: II  TM Distance: >3 FB Neck ROM: Full    Dental  (+) Teeth Intact   Pulmonary neg pulmonary ROS, Current Smoker,    Pulmonary exam normal breath sounds clear to auscultation       Cardiovascular negative cardio ROS Normal cardiovascular exam Rhythm:Regular Rate:Normal     Neuro/Psych negative neurological ROS  negative psych ROS   GI/Hepatic negative GI ROS, Neg liver ROS,   Endo/Other  negative endocrine ROS  Renal/GU negative Renal ROS     Musculoskeletal negative musculoskeletal ROS (+)   Abdominal   Peds  Hematology negative hematology ROS (+)   Anesthesia Other Findings   Reproductive/Obstetrics (+) Pregnancy                             Anesthesia Physical Anesthesia Plan  ASA: II  Anesthesia Plan: Epidural   Post-op Pain Management:    Induction:   PONV Risk Score and Plan:   Airway Management Planned:   Additional Equipment:   Intra-op Plan:   Post-operative Plan:   Informed Consent: I have reviewed the patients History and Physical, chart, labs and discussed the procedure including the risks, benefits and alternatives for the proposed anesthesia with the patient or authorized representative who has indicated his/her understanding and acceptance.     Plan Discussed with:   Anesthesia Plan Comments:         Anesthesia Quick Evaluation

## 2018-06-01 NOTE — H&P (Signed)
Brianna Jimenez is a 32 y.o. [phone] female at [redacted]w[redacted]d by LMP c/w 9wk u/s, presenting in active labor w/ SROM @ 0500, clear fluid.   Reports active fetal movement, contractions: regular, vaginal bleeding: none, membranes: ruptured, clear fluid. Initiated prenatal care at [facility] at 11 wks.     This pregnancy complicated by: H/o ectopic, GBS bacteruria  Prenatal History/Complications:  Term uncomplicated SVB x 1 Ectopic x 1 EAB x 1 SAB x 1  Past Medical History: Past Medical History:  Diagnosis Date  . Chlamydia   . Cholecystitis, unspecified   . Trichomonas     Past Surgical History: Past Surgical History:  Procedure Laterality Date  . NO PAST SURGERIES      Obstetrical History: OB History    Gravida  5   Para  1   Term  1   Preterm  0   AB  3   Living  1     SAB  1   TAB  1   Ectopic  1   Multiple  0   Live Births  1        Obstetric Comments  Hx of ectopic treated with MTX- 02/2017        Social History: Social History   Socioeconomic History  . Marital status: Single    Spouse name: Not on file  . Number of children: Not on file  . Years of education: Not on file  . Highest education level: Not on file  Occupational History  . Not on file  Social Needs  . Financial resource strain: Not on file  . Food insecurity:    Worry: Not on file    Inability: Not on file  . Transportation needs:    Medical: Not on file    Non-medical: Not on file  Tobacco Use  . Smoking status: Current Every Day Smoker    Packs/day: 0.50    Types: Cigarettes  . Smokeless tobacco: Current User  Substance and Sexual Activity  . Alcohol use: Yes    Comment: occasional  . Drug use: Yes    Frequency: 1.0 times per week    Types: Marijuana  . Sexual activity: Yes    Comment: 2 days ago  Lifestyle  . Physical activity:    Days per week: Not on file    Minutes per session: Not on file  . Stress: Not on file  Relationships  . Social connections:    Talks  on phone: Not on file    Gets together: Not on file    Attends religious service: Not on file    Active member of club or organization: Not on file    Attends meetings of clubs or organizations: Not on file    Relationship status: Not on file  Other Topics Concern  . Not on file  Social History Narrative  . Not on file    Family History: No family history on file.  Allergies: No Known Allergies  Medications Prior to Admission  Medication Sig Dispense Refill Last Dose  . Prenatal Vit-Fe Fumarate-FA (MULTIVITAMIN-PRENATAL) 27-0.8 MG TABS tablet Take 1 tablet by mouth daily at 12 noon.   Past Week at Unknown time  . Vitamin D, Ergocalciferol, (DRISDOL) 50000 units CAPS capsule Take 1 capsule (50,000 Units total) by mouth every 7 (seven) days. (Patient not taking: Reported on 05/11/2018) 30 capsule 2 Not Taking    Review of Systems  Pertinent pos/neg as indicated in HPI  Blood pressure 134/82,  pulse 79, temperature 98.1 F (36.7 C), temperature source Oral, resp. rate 18, height 5\' 5"  (1.651 m), weight 75.8 kg (167 lb), last menstrual period 08/28/2017, SpO2 99 %, unknown if currently breastfeeding. General appearance: alert, cooperative and no distress Lungs: clear to auscultation bilaterally Heart: regular rate and rhythm Abdomen: gravid, soft, non-tender Extremities: tr edema DTR's 2+  Fetal monitoring: FHR: 125 bpm, variability: moderate,  Accelerations: Present,  decelerations:  Present occ mild variablew Uterine activity: q 2-50mins Dilation: 6 Effacement (%): 90 Station: -2 Exam by:: Arlana Lindau, RN Presentation: cephalic   Prenatal labs: ABO, Rh: A/Positive/-- (12/03 1324) Antibody: Negative (12/03 1324) Rubella: 4.86 (12/03 1324) RPR: Non Reactive (03/26 1030)  HBsAg: Negative (12/03 1324)  HIV: Non Reactive (03/26 1030)  GBS:     2hr GTT: 73/108/87 Genetic screening:  neg Anatomy US: normal  Results for orders placed or performed during the hospital  encounter of 06/01/18 (from the past 24 hour(s))  POCT fern test   Collection Time: 06/01/18  6:01 AM  Result Value Ref Range   POCT Fern Test Positive = ruptured amniotic membanes   CBC   Collection Time: 06/01/18  6:15 AM  Result Value Ref Range   WBC 14.8 (H) 4.0 - 10.5 K/uL   RBC 5.45 (H) 3.87 - 5.11 MIL/uL   Hemoglobin 12.8 12.0 - 15.0 g/dL   HCT 40.7 36.0 - 46.0 %   MCV 74.7 (L) 78.0 - 100.0 fL   MCH 23.5 (L) 26.0 - 34.0 pg   MCHC 31.4 30.0 - 36.0 g/dL   RDW 16.3 (H) 11.5 - 15.5 %   Platelets 242 150 - 400 K/uL     Assessment:  [redacted]w[redacted]d SIUP  G5P1031  SROM/SOL  Cat 1 FHR  GBS  pos  Plan:  Admit to BS  IV pain meds/epidural prn active labor  Expectant management  Anticipate NSVB   Plans to breast & bottlefeed  Contraception: undecided  Circumcision: n/a  Roma Schanz CNM, WHNP-BC 06/01/2018, 7:14 AM

## 2018-06-01 NOTE — Anesthesia Postprocedure Evaluation (Signed)
Anesthesia Post Note  Patient: Brianna Jimenez  Procedure(s) Performed: AN AD HOC LABOR EPIDURAL     Patient location during evaluation: Mother Baby Anesthesia Type: Epidural Level of consciousness: awake, awake and alert and oriented Pain management: pain level controlled Vital Signs Assessment: post-procedure vital signs reviewed and stable Respiratory status: spontaneous breathing Cardiovascular status: blood pressure returned to baseline Postop Assessment: no headache, epidural receding, patient able to bend at knees, adequate PO intake, no backache, no apparent nausea or vomiting and able to ambulate Anesthetic complications: no    Last Vitals:  Vitals:   06/01/18 1000 06/01/18 1100  BP: 124/67 121/71  Pulse: 73 69  Resp: 16 16  Temp: 36.7 C 36.9 C  SpO2: 100%     Last Pain:  Vitals:   06/01/18 1100  TempSrc: Oral  PainSc: 0-No pain   Pain Goal:                 Bufford Spikes

## 2018-06-01 NOTE — Lactation Note (Signed)
This note was copied from a baby's chart. Lactation Consultation Note  Patient Name: Brianna Jimenez [id number] Date: 06/01/2018 Reason for consult: Initial assessment;Term Breastfeeding consultation services and support information given to patient.  This is her first baby and newborn is 59 hours old.  Baby latched initially after birth but mom has had difficulty since.  Baby is currently sleeping in FOB's arms.  Instructed to watch for feeding cues and call for assist prn.  Maternal Data Has patient been taught Hand Expression?: Yes Does the patient have breastfeeding experience prior to this delivery?: No  Feeding    LATCH Score                   Interventions    Lactation Tools Discussed/Used     Consult Status Consult Status: Follow-up Date: 06/02/18 Follow-up type: In-patient    Ave Filter 06/01/2018, 2:30 PM

## 2018-06-01 NOTE — MAU Note (Signed)
Pt thinks SROM around 0500.  Clear fluid Started contracting afterward. Denies bleeding. +FM

## 2018-06-01 NOTE — Anesthesia Procedure Notes (Addendum)
Epidural Patient location during procedure: OB Start time: 06/01/2018 6:50 AM End time: 06/01/2018 7:08 AM  Staffing Anesthesiologist: Nolon Nations, MD Performed: anesthesiologist   Preanesthetic Checklist Completed: patient identified, pre-op evaluation, timeout performed, IV checked, risks and benefits discussed and monitors and equipment checked  Epidural Patient position: sitting Prep: site prepped and draped and DuraPrep Patient monitoring: heart rate, continuous pulse ox and blood pressure Approach: midline Location: L3-L4 Injection technique: LOR air and LOR saline  Needle:  Needle type: Tuohy  Needle gauge: 17 G Needle length: 9 cm Needle insertion depth: 7 cm Catheter type: closed end flexible Catheter size: 19 Gauge Catheter at skin depth: 12 cm Test dose: negative  Assessment Sensory level: T8 Events: blood not aspirated, injection not painful, no injection resistance, negative IV test and no paresthesia  Additional Notes Reason for block:procedure for pain

## 2018-06-02 NOTE — Lactation Note (Signed)
This note was copied from a baby's chart. Lactation Consultation Note Mom has decided to exclusively formula feed per RN. Patient Name: Brianna Jimenez GNPHQ'N Date: 06/02/2018     Maternal Data    Feeding    LATCH Score                   Interventions    Lactation Tools Discussed/Used     Consult Status      Theodoro Kalata 06/02/2018, 4:42 AM

## 2018-06-02 NOTE — Progress Notes (Signed)
POSTPARTUM PROGRESS NOTE  Post Partum Day 2 Subjective:  Brianna Jimenez is a 32 y.o. E3P2951 [redacted]w[redacted]d s/p SVD.  No acute events overnight.  Pt denies problems with ambulating, voiding or po intake.  She denies nausea or vomiting.  Pain is well controlled.  She has had flatus. She has had bowel movement.  Lochia Minimal.   Objective: Blood pressure (!) 108/45, pulse (!) 51, temperature 98.3 F (36.8 C), temperature source Oral, resp. rate 18, height 5\' 5"  (1.651 m), weight 167 lb (75.8 kg), last menstrual period 08/28/2017, SpO2 98 %, unknown if currently breastfeeding.  Physical Exam:  General: alert, cooperative and no distress Lochia:normal flow Chest: CTAB Heart: RRR no m/r/g Abdomen: +BS, soft, nontender,  Uterine Fundus: firm,  DVT Evaluation: No calf swelling or tenderness Extremities: no edema  Recent Labs    06/01/18 0615  HGB 12.8  HCT 40.7    Assessment/Plan:  ASSESSMENT: Brianna Jimenez is a 32 y.o. O8C1660 [redacted]w[redacted]d s/p SVD  Plan for discharge tomorrow   LOS: 1 day   Bonnita Hollow, MD 06/02/2018, 8:46 AM

## 2018-06-03 MED ORDER — IBUPROFEN 600 MG PO TABS: 600.0000 mg | ORAL_TABLET | Freq: Four times a day (QID) | ORAL | 0 refills | Status: DC

## 2018-06-03 NOTE — Discharge Instructions (Signed)
Vaginal Delivery, Care After °Refer to this sheet in the next few weeks. These instructions provide you with information about caring for yourself after vaginal delivery. Your health care provider may also give you more specific instructions. Your treatment has been planned according to current medical practices, but problems sometimes occur. Call your health care provider if you have any problems or questions. °What can I expect after the procedure? °After vaginal delivery, it is common to have: °· Some bleeding from your vagina. °· Soreness in your abdomen, your vagina, and the area of skin between your vaginal opening and your anus (perineum). °· Pelvic cramps. °· Fatigue. ° °Follow these instructions at home: °Medicines °· Take over-the-counter and prescription medicines only as told by your health care provider. °· If you were prescribed an antibiotic medicine, take it as told by your health care provider. Do not stop taking the antibiotic until it is finished. °Driving ° °· Do not drive or operate heavy machinery while taking prescription pain medicine. °· Do not drive for 24 hours if you received a sedative. °Lifestyle °· Do not drink alcohol. This is especially important if you are breastfeeding or taking medicine to relieve pain. °· Do not use tobacco products, including cigarettes, chewing tobacco, or e-cigarettes. If you need help quitting, ask your health care provider. °Eating and drinking °· Drink at least 8 eight-ounce glasses of water every day unless you are told not to by your health care provider. If you choose to breastfeed your baby, you may need to drink more water than this. °· Eat high-fiber foods every day. These foods may help prevent or relieve constipation. High-fiber foods include: °? Whole grain cereals and breads. °? Brown rice. °? Beans. °? Fresh fruits and vegetables. °Activity °· Return to your normal activities as told by your health care provider. Ask your health care provider  what activities are safe for you. °· Rest as much as possible. Try to rest or take a nap when your baby is sleeping. °· Do not lift anything that is heavier than your baby or 10 lb (4.5 kg) until your health care provider says that it is safe. °· Talk with your health care provider about when you can engage in sexual activity. This may depend on your: °? Risk of infection. °? Rate of healing. °? Comfort and desire to engage in sexual activity. °Vaginal Care °· If you have an episiotomy or a vaginal tear, check the area every day for signs of infection. Check for: °? More redness, swelling, or pain. °? More fluid or blood. °? Warmth. °? Pus or a bad smell. °· Do not use tampons or douches until your health care provider says this is safe. °· Watch for any blood clots that may pass from your vagina. These may look like clumps of dark red, brown, or black discharge. °General instructions °· Keep your perineum clean and dry as told by your health care provider. °· Wear loose, comfortable clothing. °· Wipe from front to back when you use the toilet. °· Ask your health care provider if you can shower or take a bath. If you had an episiotomy or a perineal tear during labor and delivery, your health care provider may tell you not to take baths for a certain length of time. °· Wear a bra that supports your breasts and fits you well. °· If possible, have someone help you with household activities and help care for your baby for at least a few days after   you leave the hospital. °· Keep all follow-up visits for you and your baby as told by your health care provider. This is important. °Contact a health care provider if: °· You have: °? Vaginal discharge that has a bad smell. °? Difficulty urinating. °? Pain when urinating. °? A sudden increase or decrease in the frequency of your bowel movements. °? More redness, swelling, or pain around your episiotomy or vaginal tear. °? More fluid or blood coming from your episiotomy or  vaginal tear. °? Pus or a bad smell coming from your episiotomy or vaginal tear. °? A fever. °? A rash. °? Little or no interest in activities you used to enjoy. °? Questions about caring for yourself or your baby. °· Your episiotomy or vaginal tear feels warm to the touch. °· Your episiotomy or vaginal tear is separating or does not appear to be healing. °· Your breasts are painful, hard, or turn red. °· You feel unusually sad or worried. °· You feel nauseous or you vomit. °· You pass large blood clots from your vagina. If you pass a blood clot from your vagina, save it to show to your health care provider. Do not flush blood clots down the toilet without having your health care provider look at them. °· You urinate more than usual. °· You are dizzy or light-headed. °· You have not breastfed at all and you have not had a menstrual period for 12 weeks after delivery. °· You have stopped breastfeeding and you have not had a menstrual period for 12 weeks after you stopped breastfeeding. °Get help right away if: °· You have: °? Pain that does not go away or does not get better with medicine. °? Chest pain. °? Difficulty breathing. °? Blurred vision or spots in your vision. °? Thoughts about hurting yourself or your baby. °· You develop pain in your abdomen or in one of your legs. °· You develop a severe headache. °· You faint. °· You bleed from your vagina so much that you fill two sanitary pads in one hour. °This information is not intended to replace advice given to you by your health care provider. Make sure you discuss any questions you have with your health care provider. °Document Released: 11/27/2000 Document Revised: 05/13/2016 Document Reviewed: 12/15/2015 °Elsevier Interactive Patient Education © 2018 [employer] ° °

## 2018-06-03 NOTE — Discharge Summary (Addendum)
OB Discharge Summary     Patient Name: Brianna Jimenez DOB: November 23, 1986 MRN: [id number]  Date of admission: 06/01/2018 Delivering MD: Truett Mainland   Date of discharge: 06/03/2018  Admitting diagnosis: 44 WEEKS ROM CTX Intrauterine pregnancy: [redacted]w[redacted]d     Secondary diagnosis:  Active Problems:   Normal labor   Shoulder dystocia during labor and delivery, delivered  Additional problems: none    Discharge diagnosis: Term Pregnancy Delivered                                                                                                Post partum procedures:none  Augmentation: none  Complications: None  Hospital course:  Onset of Labor With Vaginal Delivery     32 y.o. yo [id number] at [redacted]w[redacted]d was admitted in Active Labor on 06/01/2018. Patient had an uncomplicated labor course as follows:  Membrane Rupture Time/Date: 5:00 AM ,06/01/2018   Intrapartum Procedures: Episiotomy: None [1]                                         Lacerations:  None [1]  Patient had a delivery of a Viable infant. 06/01/2018  Information for the patient's newborn:  Tondra, Reierson [[id number]]  Delivery Method: Vag-Spont    Pateint had an uncomplicated postpartum course.  She is ambulating, tolerating a regular diet, passing flatus, and urinating well. Patient is discharged home in stable condition on 06/03/18.   Physical exam  Vitals:   06/01/18 2315 06/02/18 0523 06/02/18 1435 06/02/18 2159  BP: 123/60 (!) 108/45 116/68 (!) 103/57  Pulse: 67 (!) 51 63 61  Resp: 18 18 17 18   Temp: 98.3 F (36.8 C)  98.3 F (36.8 C) 98.7 F (37.1 C)  TempSrc: Oral  Oral Oral  SpO2:    99%  Weight:      Height:       General: alert and no distress Lochia: appropriate Uterine Fundus: firm Incision: N/A DVT Evaluation: No evidence of DVT seen on physical exam. Labs: Lab Results  Component Value Date   WBC 14.8 (H) 06/01/2018   HGB 12.8 06/01/2018   HCT 40.7 06/01/2018   MCV 74.7 (L) 06/01/2018    PLT 242 06/01/2018   CMP Latest Ref Rng & Units 11/15/2017  Glucose 65 - 99 mg/dL 92  BUN 6 - 20 mg/dL 6  Creatinine 0.57 - 1.00 mg/dL 0.64  Sodium 134 - 144 mmol/L 137  Potassium 3.5 - 5.2 mmol/L 4.1  Chloride 96 - 106 mmol/L 102  CO2 20 - 29 mmol/L 20  Calcium 8.7 - 10.2 mg/dL 10.0  Total Protein 6.0 - 8.5 g/dL 7.3  Total Bilirubin 0.0 - 1.2 mg/dL <0.2  Alkaline Phos 39 - 117 IU/L 36(L)  AST 0 - 40 IU/L 9  ALT 0 - 32 IU/L 6    Discharge instruction: per After Visit Summary and "Baby and Me Booklet".  After visit meds:  Allergies as of 06/03/2018   No Known Allergies  Medication List    TAKE these medications   ibuprofen 600 MG tablet Commonly known as:  ADVIL,MOTRIN Take 1 tablet (600 mg total) by mouth every 6 (six) hours.   multivitamin-prenatal 27-0.8 MG Tabs tablet Take 1 tablet by mouth daily at 12 noon.       Diet: routine diet  Activity: Advance as tolerated. Pelvic rest for 6 weeks.   Outpatient follow up:6 weeks Follow up Appt: Future Appointments  Date Time Provider [place]  06/30/2018  1:00 PM Sloan Leiter, MD [place] None   Follow up Visit:No follow-ups on file.  Postpartum contraception: Undecided  Newborn Data: Live born female  Birth Weight: 6 lb 11.8 oz (3056 g) APGAR: 8, 9  Newborn Delivery   Birth date/time:  06/01/2018 08:34:00 Delivery type:  Vaginal, Spontaneous     Baby Feeding: Bottle and Breast Disposition:home with mother   06/03/2018 Bonnita Hollow, MD  Patient seen and examined, agree with above note. She is PPD 2 from SVD. Nml PP course. BP is good. She will determine contraception at [facility] visit. She has received instructions to abstain from intercourse until her f/u.  Donnamae Jude 06/03/2018 9:53 AM

## 2018-06-30 ENCOUNTER — Ambulatory Visit: Payer: Medicaid Other | Admitting: Obstetrics and Gynecology

## 2018-07-07 ENCOUNTER — Encounter (INDEPENDENT_AMBULATORY_CARE_PROVIDER_SITE_OTHER): Payer: Self-pay

## 2019-01-03 IMAGING — US US OB TRANSVAGINAL
1 series · 15 of 28 positions shown · non-contrast
Comparison: Pelvic ultrasound performed 02/17/2016

CLINICAL DATA: Acute onset of pelvic cramping.  Initial encounter.

EXAM:
OBSTETRIC <14 WK US AND TRANSVAGINAL OB US
TECHNIQUE: Both transabdominal and transvaginal ultrasound examinations were
performed for complete evaluation of the gestation as well as the
maternal uterus, adnexal regions, and pelvic cul-de-sac.
Transvaginal technique was performed to assess early pregnancy.

[Series 1: us ob transvaginal · 15 of 59 slices shown]
[im 1/59]
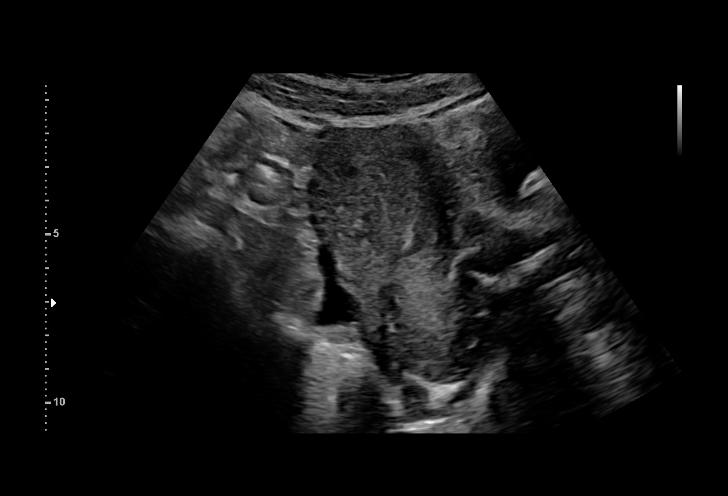
[im 5/59]
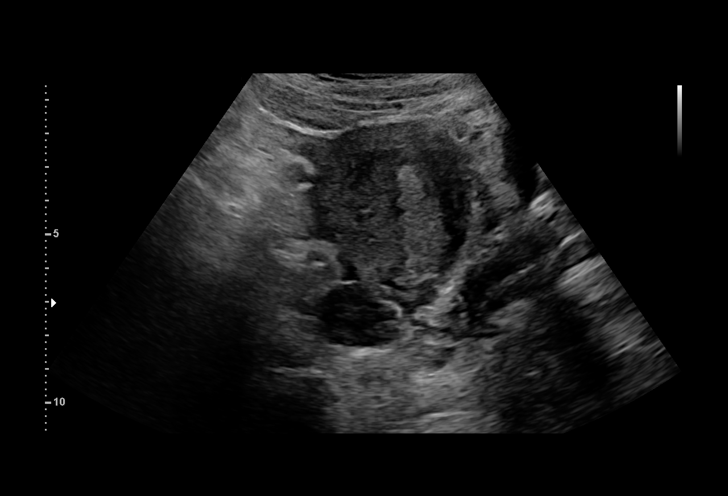
[im 9/59]
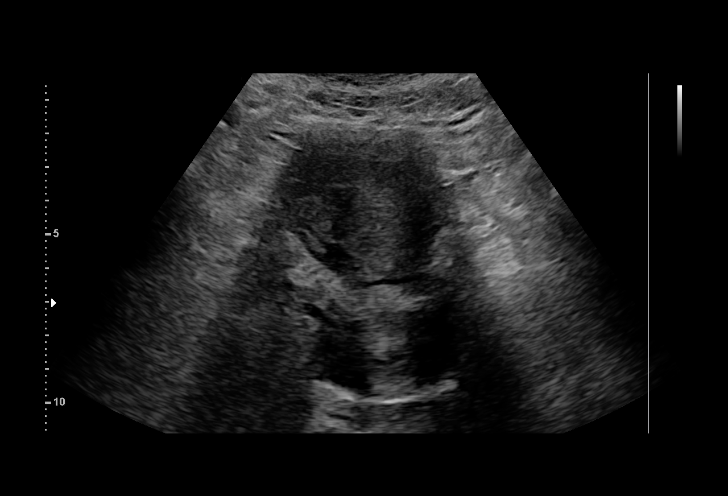
[im 13/59]
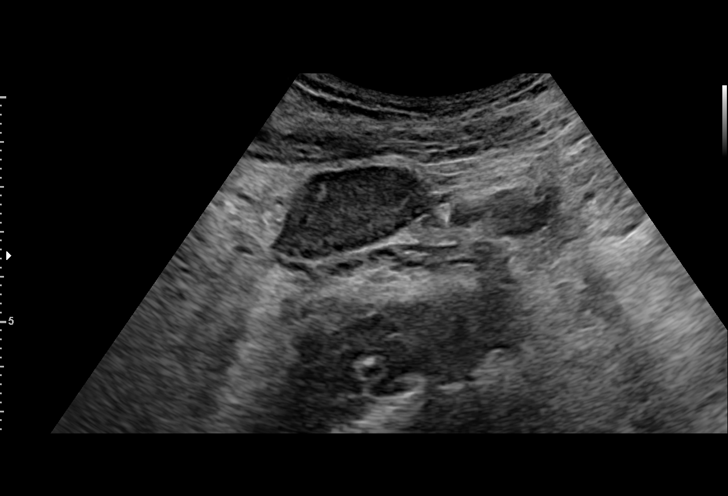
[im 18/59]
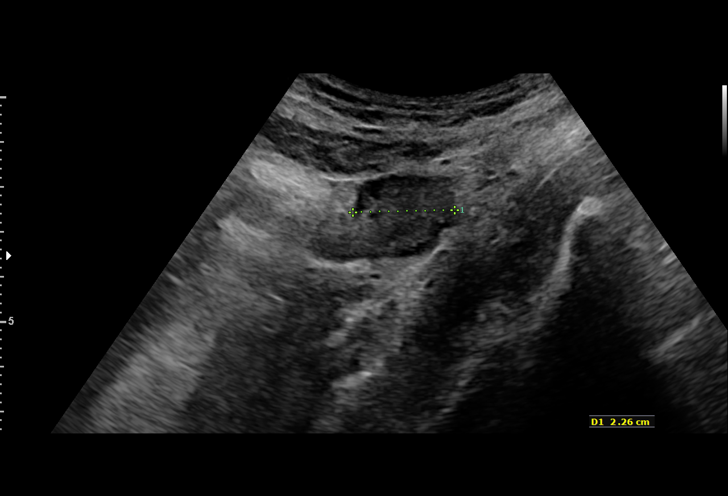
[im 22/59]
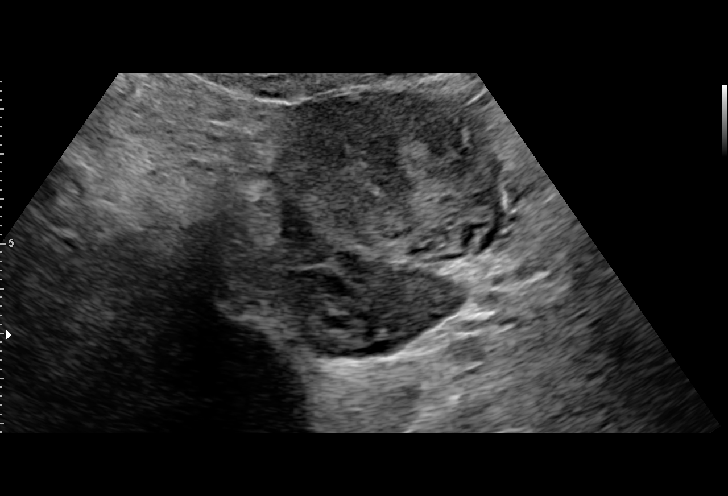
[im 26/59]
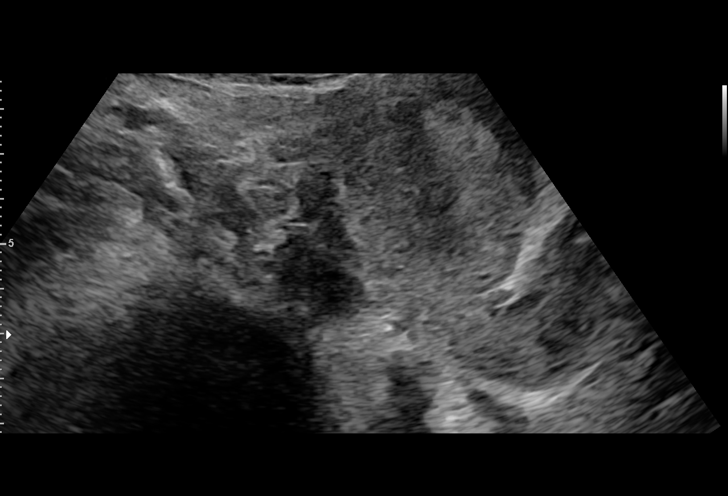
[im 31/59]
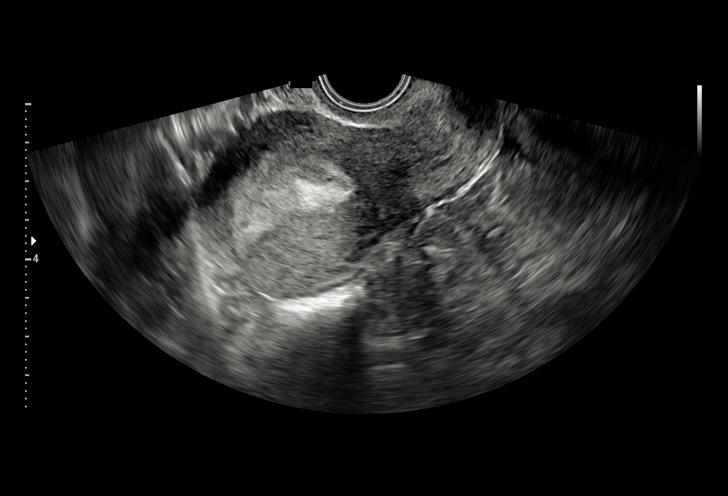
[im 33/59]
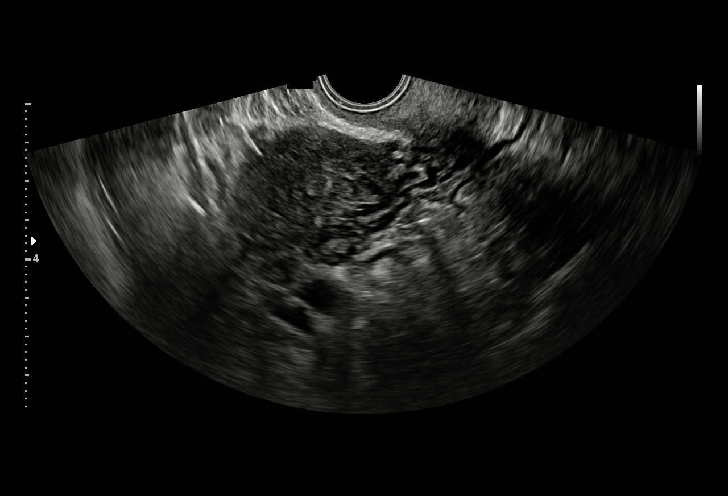
[im 37/59]
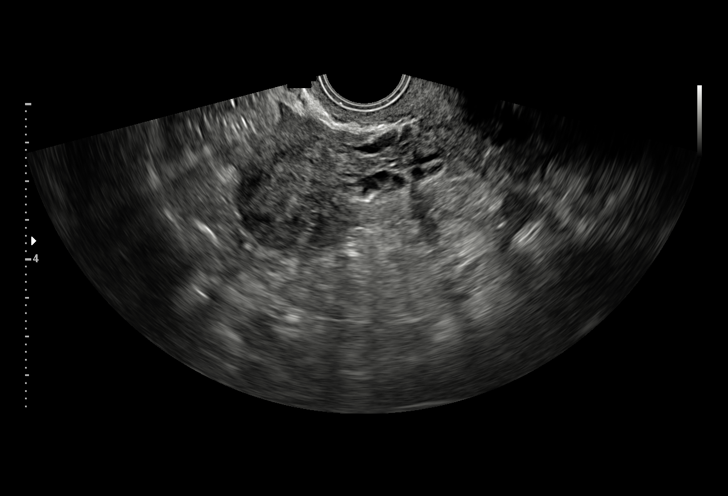
[im 41/59]
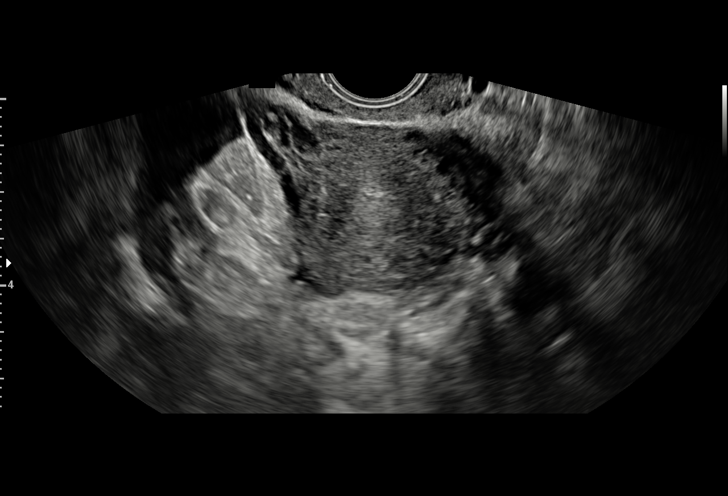
[im 46/59]
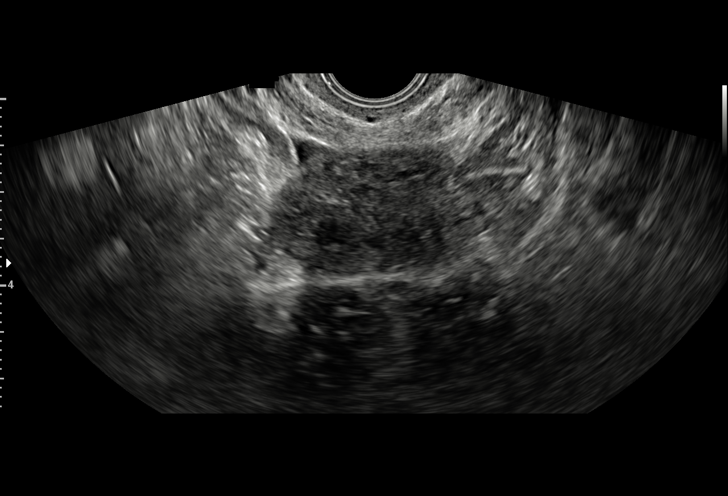
[im 50/59]
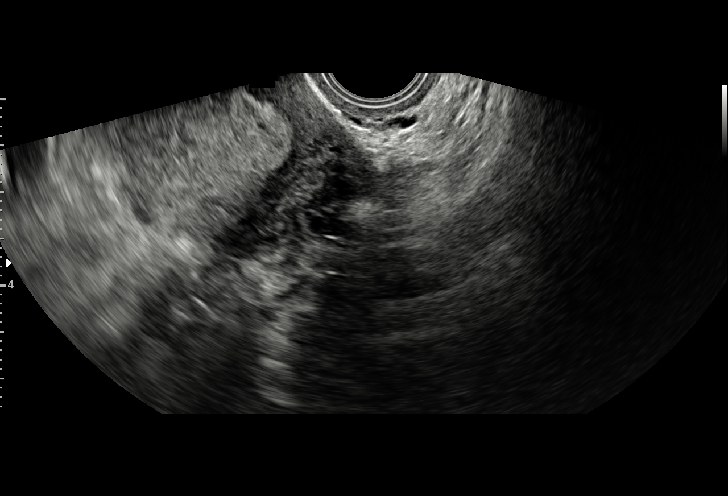
[im 54/59]
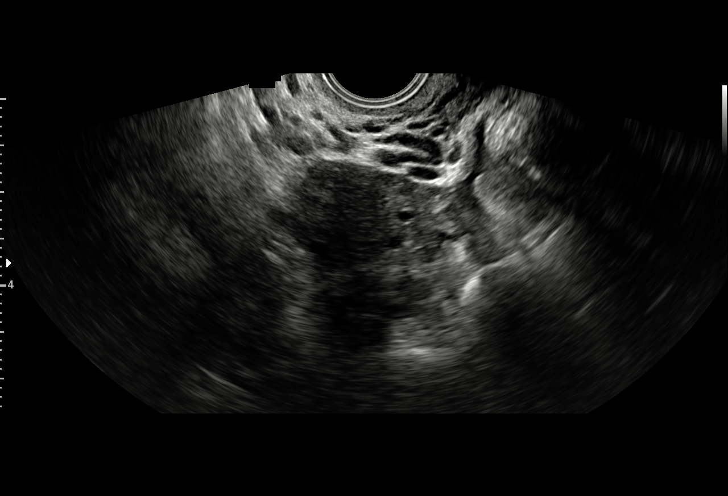
[im 59/59]
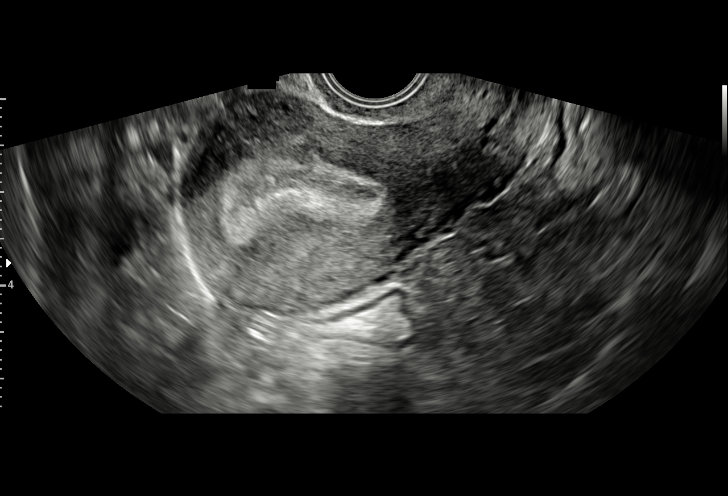

[15 of 28 positions shown; findings below may reference images not displayed]

FINDINGS: Intrauterine gestational sac: None seen.

Yolk sac:  N/A

Embryo:  N/A

Subchorionic hemorrhage:  None visualized.

Maternal uterus/adnexae: A 1.6 cm myometrial fibroid is noted at the
uterine fundus.

The ovaries are within normal limits. The right ovary measures 4.5 x
2.2 x 2.3 cm, while the left ovary measures 3.5 x 1.9 x 2.3 cm. No
suspicious adnexal masses are seen; there is no evidence for ovarian
torsion.

No free fluid is seen within the pelvic cul-de-sac.
IMPRESSION: 1. No intrauterine gestational sac seen. No evidence for ectopic
pregnancy at this time. If the patient's quantitative beta HCG level
continues to rise, would perform follow-up pelvic ultrasound in 2
weeks.
2. 1.6 cm myometrial fibroid incidentally noted at the uterine
fundus.

## 2019-02-15 ENCOUNTER — Emergency Department (HOSPITAL_COMMUNITY)
Admission: EM | Admit: 2019-02-15 | Discharge: 2019-02-15 | Disposition: A | Discharge status: Home / Self Care | Payer: Medicaid Other | Attending: Emergency Medicine | Admitting: Emergency Medicine

## 2019-02-15 ENCOUNTER — Emergency Department (HOSPITAL_COMMUNITY): Payer: Medicaid Other

## 2019-02-15 ENCOUNTER — Other Ambulatory Visit: Payer: Self-pay

## 2019-02-15 ENCOUNTER — Encounter (HOSPITAL_COMMUNITY): Payer: Self-pay

## 2019-02-15 DIAGNOSIS — Z3A00 Weeks of gestation of pregnancy not specified: Secondary | ICD-10-CM | POA: Insufficient documentation

## 2019-02-15 DIAGNOSIS — O99330 Smoking (tobacco) complicating pregnancy, unspecified trimester: Secondary | ICD-10-CM | POA: Insufficient documentation

## 2019-02-15 DIAGNOSIS — O23599 Infection of other part of genital tract in pregnancy, unspecified trimester: Secondary | ICD-10-CM | POA: Diagnosis not present

## 2019-02-15 DIAGNOSIS — O9989 Other specified diseases and conditions complicating pregnancy, childbirth and the puerperium: Secondary | ICD-10-CM | POA: Diagnosis present

## 2019-02-15 DIAGNOSIS — F17210 Nicotine dependence, cigarettes, uncomplicated: Secondary | ICD-10-CM | POA: Insufficient documentation

## 2019-02-15 DIAGNOSIS — Z79899 Other long term (current) drug therapy: Secondary | ICD-10-CM | POA: Insufficient documentation

## 2019-02-15 DIAGNOSIS — R102 Pelvic and perineal pain: Secondary | ICD-10-CM | POA: Insufficient documentation

## 2019-02-15 DIAGNOSIS — B9689 Other specified bacterial agents as the cause of diseases classified elsewhere: Secondary | ICD-10-CM

## 2019-02-15 DIAGNOSIS — Z3490 Encounter for supervision of normal pregnancy, unspecified, unspecified trimester: Secondary | ICD-10-CM

## 2019-02-15 DIAGNOSIS — N760 Acute vaginitis: Secondary | ICD-10-CM

## 2019-02-15 LAB — COMPREHENSIVE METABOLIC PANEL
ALK PHOS: 43 U/L (ref 38–126)
ALT: 18 U/L (ref 0–44)
ANION GAP: 8 (ref 5–15)
AST: 17 U/L (ref 15–41)
Albumin: 4.2 g/dL (ref 3.5–5.0)
BUN: 7 mg/dL (ref 6–20)
CALCIUM: 9.3 mg/dL (ref 8.9–10.3)
CO2: 24 mmol/L (ref 22–32)
Chloride: 107 mmol/L (ref 98–111)
Creatinine, Ser: 0.73 mg/dL (ref 0.44–1.00)
GFR calc non Af Amer: >60 mL/min (ref >60)
Glucose, Bld: 84 mg/dL (ref 70–99)
Potassium: 4 mmol/L (ref 3.5–5.1)
SODIUM: 139 mmol/L (ref 135–145)
TOTAL PROTEIN: 8.1 g/dL (ref 6.5–8.1)
Total Bilirubin: 0.8 mg/dL (ref 0.3–1.2)

## 2019-02-15 LAB — URINALYSIS, ROUTINE W REFLEX MICROSCOPIC
BILIRUBIN URINE: NEGATIVE
Glucose, UA: 50 mg/dL — AB
HGB URINE DIPSTICK: NEGATIVE
Ketones, ur: 20 mg/dL — AB
Leukocytes,Ua: NEGATIVE
NITRITE: NEGATIVE
PROTEIN: NEGATIVE mg/dL
Specific Gravity, Urine: 1.018 (ref 1.005–1.030)
pH: 7 (ref 5.0–8.0)

## 2019-02-15 LAB — I-STAT BETA HCG BLOOD, ED (MC, WL, AP ONLY): I-stat hCG, quantitative: 1117.9 m[IU]/mL — ABNORMAL HIGH (ref <5)

## 2019-02-15 LAB — WET PREP, GENITAL
Sperm: NONE SEEN
TRICH WET PREP: NONE SEEN
YEAST WET PREP: NONE SEEN

## 2019-02-15 LAB — CBC
HCT: 41.9 % (ref 36.0–46.0)
HEMOGLOBIN: 12.6 g/dL (ref 12.0–15.0)
MCH: 25.1 pg — AB (ref 26.0–34.0)
MCHC: 30.1 g/dL (ref 30.0–36.0)
MCV: 83.5 fL (ref 80.0–100.0)
Platelets: 279 10*3/uL (ref 150–400)
RBC: 5.02 MIL/uL (ref 3.87–5.11)
RDW: 14.5 % (ref 11.5–15.5)
WBC: 9.5 10*3/uL (ref 4.0–10.5)
nRBC: 0 % (ref 0.0–0.2)

## 2019-02-15 LAB — HCG, QUANTITATIVE, PREGNANCY: hCG, Beta Chain, Quant, S: 1066 m[IU]/mL — ABNORMAL HIGH (ref <5)

## 2019-02-15 MED ORDER — METRONIDAZOLE 500 MG PO TABS: 500.0000 mg | ORAL_TABLET | Freq: Two times a day (BID) | ORAL | 0 refills | Status: DC

## 2019-02-15 NOTE — ED Provider Notes (Signed)
[facility] DEPT Provider Note   CSN: [id number] Arrival date & time: 02/15/19  1047  History   Chief Complaint Chief Complaint  Patient presents with  . Pelvic Pain    HPI Brianna Jimenez is a 33 y.o. female with a hx of G5P2A3 (not including + pregnancy test at home today), prior ectopic pregnancy, and tobacco abuse who presents to the ED with complaints of intermittent pelvic pain x 5 days. Patient states pain is intermittent, located in the bilateral pelvic area, and feels like a typical menstrual cramp that last seconds to minutes. She reports associated nausea with a few episodes of emesis in the AMs, but has been able to tolerate PO throughout the day. No specific alleviating/aggravating factors. She is concerned for pregnancy, took pregnancy test which was positive at home this AM. Denies fever, chills, hematemesis, diarrhea, melena, hematochezia,  Dysuria, or vaginal discharge/bleeding. LMP 01/28. No concern for STDs.      HPI  Past Medical History:  Diagnosis Date  . Chlamydia   . Cholecystitis, unspecified   . Trichomonas     Patient Active Problem List   Diagnosis Date Noted  . Normal labor 06/01/2018  . Shoulder dystocia during labor and delivery, delivered 06/01/2018  . Positive GBS test 11/23/2017  . Vitamin D deficiency 11/19/2017  . Supervision of other normal pregnancy, antepartum 11/15/2017  . History of ectopic pregnancy 11/15/2017    Past Surgical History:  Procedure Laterality Date  . NO PAST SURGERIES       OB History    Gravida  5   Para  2   Term  2   Preterm  0   AB  3   Living  2     SAB  1   TAB  1   Ectopic  1   Multiple  0   Live Births  2        Obstetric Comments  Hx of ectopic treated with MTX- 02/2017         Home Medications    Prior to Admission medications   Medication Sig Start Date End Date Taking? Authorizing Provider  ibuprofen (ADVIL,MOTRIN) 600 MG tablet Take 1  tablet (600 mg total) by mouth every 6 (six) hours. 06/03/18   Bonnita Hollow, MD  Prenatal Vit-Fe Fumarate-FA (MULTIVITAMIN-PRENATAL) 27-0.8 MG TABS tablet Take 1 tablet by mouth daily at 12 noon.    [provider]    Family History History reviewed. No pertinent family history.  Social History Social History   Tobacco Use  . Smoking status: Current Every Day Smoker    Packs/day: 0.50    Types: Cigarettes  . Smokeless tobacco: Current User  Substance Use Topics  . Alcohol use: Yes    Comment: occasional  . Drug use: Yes    Frequency: 1.0 times per week    Types: Marijuana     Allergies   Patient has no known allergies.   Review of Systems Review of Systems  Constitutional: Negative for chills and fever.  Respiratory: Negative for shortness of breath.   Cardiovascular: Negative for chest pain.  Gastrointestinal: Positive for nausea and vomiting. Negative for blood in stool, constipation and diarrhea.  Genitourinary: Positive for pelvic pain. Negative for dysuria, vaginal bleeding and vaginal discharge.  Neurological: Negative for syncope.  All other systems reviewed and are negative.    Physical Exam Updated Vital Signs BP 121/82 (BP Location: Right Arm)   Pulse 69   Temp  98.4 F (36.9 C) (Oral)   Resp 16   Ht 5\' 5"  (1.651 m)   Wt 70.3 kg   LMP 01/13/2019   SpO2 100%   BMI 25.79 kg/m   Physical Exam Vitals signs and nursing note reviewed. Exam conducted with a chaperone present.  Constitutional:      General: She is not in acute distress.    Appearance: She is well-developed. She is not toxic-appearing.  HENT:     Head: Normocephalic and atraumatic.  Eyes:     General:        Right eye: No discharge.        Left eye: No discharge.     Conjunctiva/sclera: Conjunctivae normal.  Neck:     Musculoskeletal: Neck supple.  Cardiovascular:     Rate and Rhythm: Normal rate and regular rhythm.  Pulmonary:     Effort: Pulmonary effort is normal.  No respiratory distress.     Breath sounds: Normal breath sounds. No wheezing, rhonchi or rales.  Abdominal:     General: There is no distension.     Palpations: Abdomen is soft.     Tenderness: There is abdominal tenderness (mild) in the suprapubic area. There is no right CVA tenderness, left CVA tenderness, guarding or rebound. Negative signs include McBurney's sign.  Genitourinary:    Exam position: Supine.     Pubic Area: No rash.      Labia:        Right: No lesion.        Left: No lesion.      Vagina: Vaginal discharge (mild amount- white) present. No bleeding.     Cervix: No cervical motion tenderness or friability.     Adnexa:        Right: No mass, tenderness or fullness.         Left: No mass, tenderness or fullness.    Skin:    General: Skin is warm and dry.     Findings: No rash.  Neurological:     Mental Status: She is alert.     Comments: Clear speech.   Psychiatric:        Behavior: Behavior normal.    ED Treatments / Results  Labs (all labs ordered are listed, but only abnormal results are displayed) Labs Reviewed  WET PREP, GENITAL - Abnormal; Notable for the following components:      Result Value   Clue Cells Wet Prep HPF POC PRESENT (*)    WBC, Wet Prep HPF POC MANY (*)    All other components within normal limits  CBC - Abnormal; Notable for the following components:   MCH 25.1 (*)    All other components within normal limits  URINALYSIS, ROUTINE W REFLEX MICROSCOPIC - Abnormal; Notable for the following components:   Glucose, UA 50 (*)    Ketones, ur 20 (*)    All other components within normal limits  HCG, QUANTITATIVE, PREGNANCY - Abnormal; Notable for the following components:   hCG, Beta Chain, Quant, S 1,066 (*)    All other components within normal limits  I-STAT BETA HCG BLOOD, ED ([facility], [facility], [facility] ONLY) - Abnormal; Notable for the following components:   I-stat hCG, quantitative 1,117.9 (*)    All other components within normal limits    COMPREHENSIVE METABOLIC PANEL  RPR  HIV ANTIBODY (ROUTINE TESTING W REFLEX)  GC/CHLAMYDIA PROBE AMP ([place]) NOT AT [facility]    EKG None  Radiology US Ob Comp < 14 Wks  Result Date: 02/15/2019 CLINICAL DATA:  Left pelvic pain EXAM: OBSTETRIC <14 WK [place] AND TRANSVAGINAL OB US TECHNIQUE: Both transabdominal and transvaginal ultrasound examinations were performed for complete evaluation of the gestation as well as the maternal uterus, adnexal regions, and pelvic cul-de-sac. Transvaginal technique was performed to assess early pregnancy. COMPARISON:  None. FINDINGS: Intrauterine gestational sac: Absent . Maternal uterus/adnexae: Uterus is within normal limits. Prominent endometrium at 1.8 cm is noted. Right ovary is well visualized measuring 4.0 x 3.5 x 1.9 cm. Left ovary measures 2.4 x 2.3 x 1.9 cm. Complex cystic lesion is noted which could represent a corpus luteum cyst. IMPRESSION: No definitive intrauterine gestational sac is noted. Possible early IUP given positive pregnancy test. Continued follow-up and serial beta HCGs are recommended. Electronically Signed   By: Inez Catalina M.D.   On: 02/15/2019 15:06   US Ob Transvaginal  Result Date: 02/15/2019 CLINICAL DATA:  Left pelvic pain EXAM: OBSTETRIC <14 WK [place] AND TRANSVAGINAL OB US TECHNIQUE: Both transabdominal and transvaginal ultrasound examinations were performed for complete evaluation of the gestation as well as the maternal uterus, adnexal regions, and pelvic cul-de-sac. Transvaginal technique was performed to assess early pregnancy. COMPARISON:  None. FINDINGS: Intrauterine gestational sac: Absent . Maternal uterus/adnexae: Uterus is within normal limits. Prominent endometrium at 1.8 cm is noted. Right ovary is well visualized measuring 4.0 x 3.5 x 1.9 cm. Left ovary measures 2.4 x 2.3 x 1.9 cm. Complex cystic lesion is noted which could represent a corpus luteum cyst. IMPRESSION: No definitive intrauterine gestational sac is noted. Possible  early IUP given positive pregnancy test. Continued follow-up and serial beta HCGs are recommended. Electronically Signed   By: Inez Catalina M.D.   On: 02/15/2019 15:06    Procedures Procedures (including critical care time)  Medications Ordered in ED Medications - No data to display   Initial Impression / Assessment and Plan / ED Course  I have reviewed the triage vital signs and the nursing notes.  Pertinent labs & imaging results that were available during my care of the patient were reviewed by me and considered in my medical decision making (see chart for details).   Patient presents to the emergency department with intermittent pelvic pain and concern for pregnancy.  Nontoxic-appearing, no apparent distress, vitals WNL.  Physical exam notable for some mild suprapubic tenderness, no peritoneal signs, no adnexal or cervical motion tenderness.  She also has some mild white vaginal discharge on speculum exam.  I-STAT beta-hCG is positive, will proceed with basic labs, urinalysis, STD check, quantitative hCG, and ultrasound.  Work-up reviewed: CBC: No anemia or leukocytosis CMP: Within normal limits, no electrolyte, renal function, or LFT disturbance. Urinalysis: ketonuria/glucosuria present- no anion gap acidosis, no hyperglycemia- will need PCP/obgyn recheck.  No UTI or asymptomatic bacteriuria requiring antibiotics. Wet Prep: Findings consistent with BV which will be treated with Flagyl. GC/chlamydia/RPR/HIV: Pending  Quantitative hCG is 1066, US reveals no definitive intrauterine gestational sac. Possible early IUP given positive pregnancy test. Continued follow-up and serial beta HCGs are recommended.  Patient has remained hemodynamically stable throughout ER stay.  She is requesting discharge.  She has been able to tolerate p.o. while in the emergency department.  We will have her follow-up with women's for a repeat quantitative hCG within 72 hours, we discussed extremely strict return  precautions as ectopic pregnancy has not entirely been ruled out. I discussed results, treatment plan, need for follow-up, and return precautions with the patient. Provided opportunity for questions, patient confirmed  understanding and is in agreement with plan.   Final Clinical Impressions(s) / ED Diagnoses   Final diagnoses:  Pregnancy, unspecified gestational age  BV (bacterial vaginosis)    ED Discharge Orders         Ordered    metroNIDAZOLE (FLAGYL) 500 MG tablet  2 times daily     02/15/19 [address], [place], PA-C 02/15/19 1624    Dorie Rank, MD 02/18/19 [phone]

## 2019-02-15 NOTE — Discharge Instructions (Addendum)
You were seen in the emergency department today for pelvic pain.  Your pregnancy test was positive.  Your ultrasound was inconclusive therefore we are unable to tell if this is a normal pregnancy or an ectopic pregnancy.  It is extremely important that you follow-up with the women's health clinic or with the MAU (women's health emergency department) within 72 hours for a repeat quantitative hCG to evaluate if this is increasing, decreasing, or stagnant.  This will better assess and determine what type of pregnancy this is.  Additional lab abnormalities include that you have some ketones and glucose in your urine that should also be rechecked.  Your blood prep showed that you have bacterial vaginosis, this is not sexually transmitted.  We are treating this with Flagyl, an antibiotic, please take this as prescribed. We have prescribed you new medication(s) today. Discuss the medications prescribed today with your pharmacist as they can have adverse effects and interactions with your other medicines including over the counter and prescribed medications. Seek medical evaluation if you start to experience new or abnormal symptoms after taking one of these medicines, seek care immediately if you start to experience difficulty breathing, feeling of your throat closing, facial swelling, or rash as these could be indications of a more serious allergic reaction  Please start a prenatal vitamin.  Please avoid alcohol, drugs, or medications not safe in pregnancy.  Please follow-up within 72 hours at 1 of the provided locations.  You may also return to the emergency department.  Return to the ER immediately sooner for new or worsening symptoms including but not limited to worsening pain, persistent pain, vaginal bleeding, lightheadedness, dizziness, passing out, chest pain, trouble breathing, or any other concerns that you may have.

## 2019-02-15 NOTE — ED Triage Notes (Signed)
Pt reports that she has had intermittent mild abdominal pain and vomiting x1 week. Pt reports taking a home pregnancy test which was positive. Pt reports having her last baby 8 months ago. Pt also reports hx of ectopic pregnancy, miscarriage, and abortion. Pt wants confirmation of pregnancy.

## 2019-02-15 NOTE — ED Notes (Signed)
Pt refused discharge vital signs

## 2019-02-16 LAB — HIV ANTIBODY (ROUTINE TESTING W REFLEX): HIV Screen 4th Generation wRfx: NONREACTIVE

## 2019-02-16 LAB — GC/CHLAMYDIA PROBE AMP (~~LOC~~) NOT AT ARMC
CHLAMYDIA, DNA PROBE: NEGATIVE
NEISSERIA GONORRHEA: NEGATIVE

## 2019-02-16 LAB — RPR: RPR: NONREACTIVE

## 2019-02-22 ENCOUNTER — Other Ambulatory Visit: Payer: Medicaid Other

## 2019-02-22 ENCOUNTER — Inpatient Hospital Stay (HOSPITAL_COMMUNITY)
Admission: AD | Admit: 2019-02-22 | Discharge: 2019-02-22 | Disposition: A | Discharge status: Home / Self Care | Payer: Medicaid Other | Attending: Obstetrics and Gynecology | Admitting: Obstetrics and Gynecology

## 2019-02-22 ENCOUNTER — Other Ambulatory Visit: Payer: Self-pay

## 2019-02-22 ENCOUNTER — Encounter (HOSPITAL_COMMUNITY): Payer: Self-pay | Admitting: *Deleted

## 2019-02-22 DIAGNOSIS — Z3A01 Less than 8 weeks gestation of pregnancy: Secondary | ICD-10-CM | POA: Insufficient documentation

## 2019-02-22 DIAGNOSIS — O26891 Other specified pregnancy related conditions, first trimester: Secondary | ICD-10-CM | POA: Diagnosis present

## 2019-02-22 DIAGNOSIS — O3680X0 Pregnancy with inconclusive fetal viability, not applicable or unspecified: Secondary | ICD-10-CM

## 2019-02-22 NOTE — Progress Notes (Unsigned)
Per Dr. Harolyn Rutherford if pt is asymptomatic pt does not need to be a STAT beta.  Confirmed with pt if she is having any sx.  Pt denies any pain or bleeding.  Pt explained that we will call her with results and f/u.  Pt agreed.

## 2019-02-22 NOTE — MAU Note (Signed)
Pt presents to MAU for repeat labs. Pt was evaluated at [facility] on 02/15/19 and was told her pregnancy test was positive. Told to follow up for repeat QUANT due to history of ectopic Denies any pain or VB

## 2019-02-22 NOTE — MAU Provider Note (Signed)
  S Ms. Brianna Jimenez is a 33 y.o. [phone] non-pregnant female who presents to MAU today for repeat HCG. She was seen at [facility] on 02/15/2019 where they did an ectopic w/u. She was advised to return to [facility] for repeat HCG in 72 hrs (02/18/2019). She did not come on 02/18/2019, but is here today for the repeat blood work.  O BP 122/73   Pulse 68   Temp 98.9 F (37.2 C)   Resp 16   Wt 72.1 kg   LMP 01/13/2019   SpO2 100%   BMI 26.46 kg/m  Physical Exam  Nursing note and vitals reviewed. Constitutional: She is oriented to person, place, and time. She appears well-developed and well-nourished.  HENT:  Head: Normocephalic and atraumatic.  Eyes: Pupils are equal, round, and reactive to light.  Neck: Normal range of motion.  Cardiovascular: Normal rate.  Respiratory: Effort normal.  Musculoskeletal: Normal range of motion.  Neurological: She is alert and oriented to person, place, and time.  Skin: Skin is warm and dry.  Psychiatric: She has a normal mood and affect. Her behavior is normal. Judgment and thought content normal.    A Pregnancy of Unknown Location Medical screening exam complete  P Discharge from MAU in stable condition - Card information given to go to [place] for repeat blood work - Appt made for today @ 1100, TC to [place] to inform her that patient is on her way Patient may return to MAU as needed for pregnancy related complaints  Laury Deep, CNM 02/22/2019 10:58 AM

## 2019-02-23 ENCOUNTER — Other Ambulatory Visit: Payer: Self-pay | Admitting: Obstetrics & Gynecology

## 2019-02-23 ENCOUNTER — Encounter: Payer: Self-pay | Admitting: Obstetrics & Gynecology

## 2019-02-23 ENCOUNTER — Telehealth: Payer: Self-pay | Admitting: *Deleted

## 2019-02-23 DIAGNOSIS — O3680X0 Pregnancy with inconclusive fetal viability, not applicable or unspecified: Secondary | ICD-10-CM

## 2019-02-23 LAB — BETA HCG QUANT (REF LAB): hCG Quant: 7412 m[IU]/mL

## 2019-02-23 NOTE — Telephone Encounter (Signed)
-----   Message from Osborne Oman, MD sent at 02/23/2019 11:29 AM EDT ----- Patient had more than appropriate rise in HCG. Please schedule ultrasound for viability as soon as possible, order placed.  Please call to inform patient of results and recommendations.

## 2019-02-23 NOTE — Telephone Encounter (Signed)
I scheduled [place] for first available appointment for 02/28/19 at 3:45 pm . I called [facility] and left a message I am calling with some information and to notify her I have scheduled an [place] for 02/28/19 at 3:45 here in [place] dept and will send MyChart message with more information. May message [place] or call us if questions.

## 2019-02-27 ENCOUNTER — Telehealth: Payer: Self-pay | Admitting: Family Medicine

## 2019-02-27 NOTE — Telephone Encounter (Signed)
Attempted to call patient to inform her of the restrictions at the office due to the coronavirus. No answer, left detailed message with the restrictions and the office number if needing to reschedule.

## 2019-02-28 ENCOUNTER — Other Ambulatory Visit: Payer: Self-pay

## 2019-02-28 ENCOUNTER — Ambulatory Visit (HOSPITAL_COMMUNITY)
Admission: RE | Admit: 2019-02-28 | Discharge: 2019-02-28 | Disposition: A | Discharge status: Home / Self Care | Payer: Medicaid Other | Source: Ambulatory Visit | Attending: Obstetrics & Gynecology | Admitting: Obstetrics & Gynecology

## 2019-02-28 ENCOUNTER — Ambulatory Visit: Payer: Medicaid Other

## 2019-02-28 DIAGNOSIS — O3680X0 Pregnancy with inconclusive fetal viability, not applicable or unspecified: Secondary | ICD-10-CM

## 2019-03-01 ENCOUNTER — Telehealth: Payer: Self-pay

## 2019-03-01 NOTE — Telephone Encounter (Addendum)
-----   Message from Osborne Oman, MD sent at 03/01/2019  7:02 AM EDT ----- Normal IUP. Needs to start prenatal care in about one month. Needs prenatal vitamins  Notified pt provider's recommendation.  Pt verbalized understanding.

## 2019-03-15 NOTE — Progress Notes (Signed)
Letter for work

## 2019-03-16 ENCOUNTER — Other Ambulatory Visit: Payer: Self-pay

## 2019-03-16 DIAGNOSIS — B9689 Other specified bacterial agents as the cause of diseases classified elsewhere: Secondary | ICD-10-CM

## 2019-03-16 DIAGNOSIS — N760 Acute vaginitis: Principal | ICD-10-CM

## 2019-03-16 MED ORDER — METRONIDAZOLE 500 MG PO TABS: 500.0000 mg | ORAL_TABLET | Freq: Two times a day (BID) | ORAL | 0 refills | Status: DC

## 2019-03-16 NOTE — Progress Notes (Signed)
Diagnosed with BV at hospital on 02/15/19, medication never sent. Rx Flagyl sent per protocol, pt verbalizes understanding.

## 2019-03-29 ENCOUNTER — Telehealth: Payer: Self-pay | Admitting: Advanced Practice Midwife

## 2019-03-29 ENCOUNTER — Encounter: Payer: Medicaid Other | Admitting: Advanced Practice Midwife

## 2019-03-29 NOTE — Telephone Encounter (Signed)
Patient was a no-show to today's New OB appt  Mallie Snooks, CNM 03/29/19  9:43 AM

## 2019-04-04 ENCOUNTER — Encounter: Payer: Medicaid Other | Admitting: Advanced Practice Midwife

## 2019-08-08 IMAGING — US US OB TRANSVAGINAL
1 series · 15 of 28 positions shown · non-contrast
Comparison: 03/06/2017

ADDENDUM:
Voice recognition error within the impression. The second sentence
should read

No fetal pole currently.
A yolk sac was visualized.
CLINICAL DATA: History of ectopic pregnancy. Positive pregnancy
test, abdominal pain
EXAM:
OBSTETRIC <14 WK US AND TRANSVAGINAL OB US
TECHNIQUE: Both transabdominal and transvaginal ultrasound examinations were
performed for complete evaluation of the gestation as well as the
maternal uterus, adnexal regions, and pelvic cul-de-sac.
Transvaginal technique was performed to assess early pregnancy.

[Series 1: us ob transvaginal · 15 of 63 slices shown]
[im 1/63]
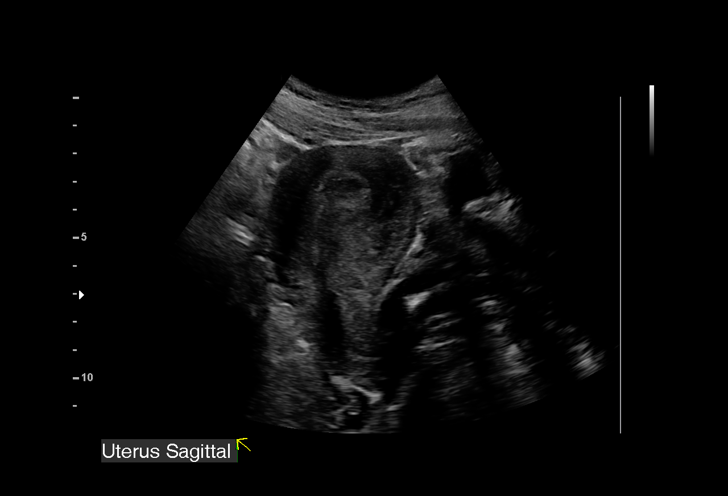
[im 5/63]
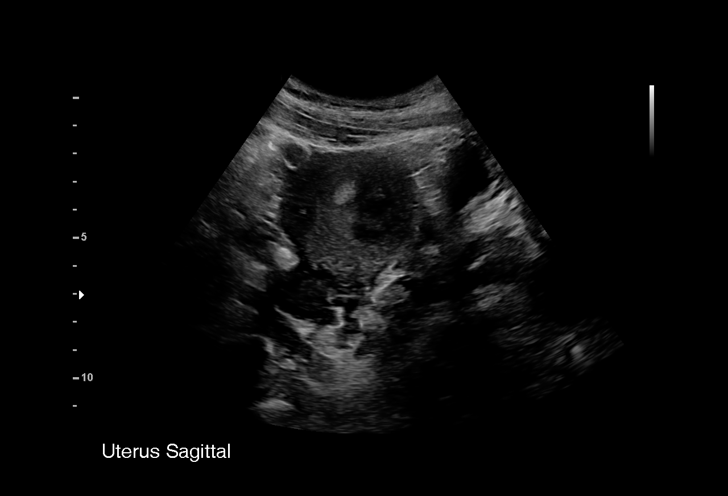
[im 10/63]
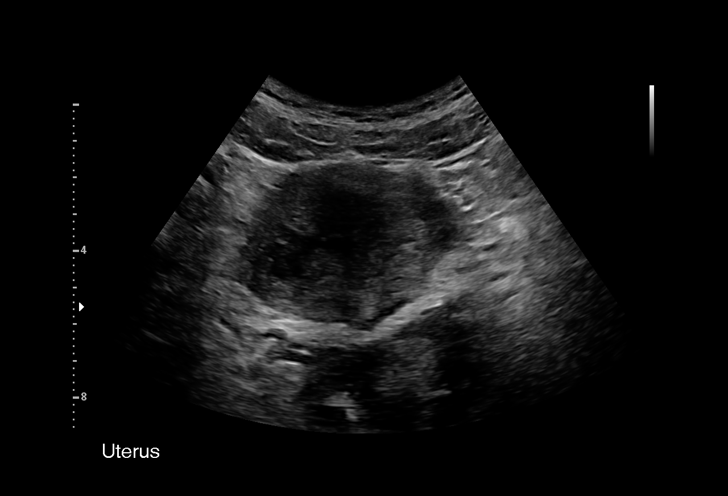
[im 14/63]
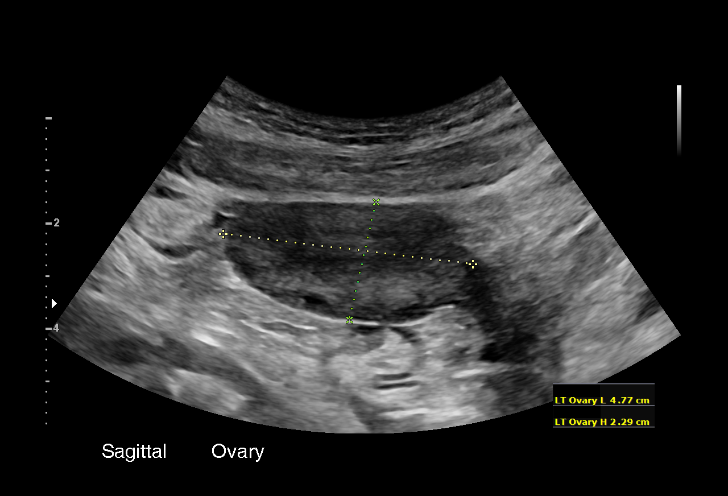
[im 19/63]
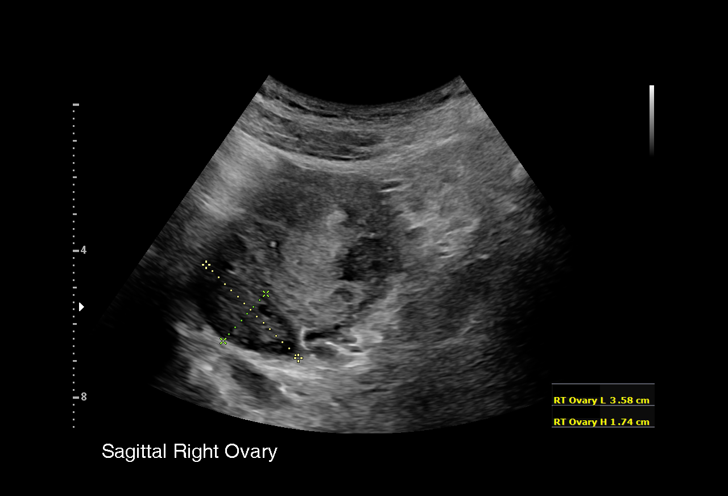
[im 23/63]
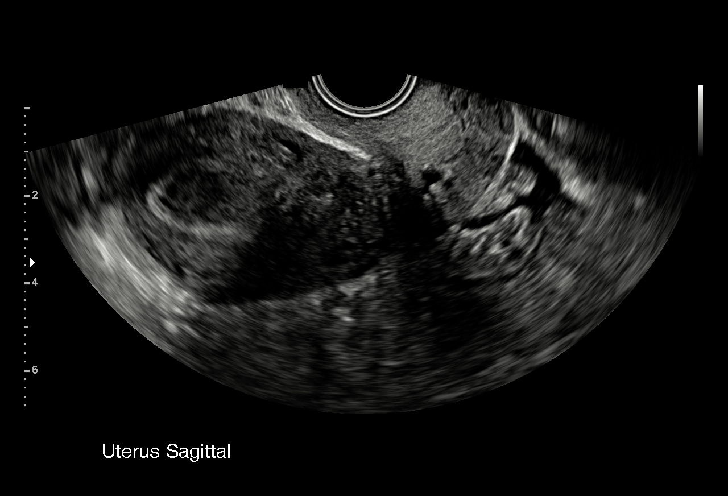
[im 28/63]
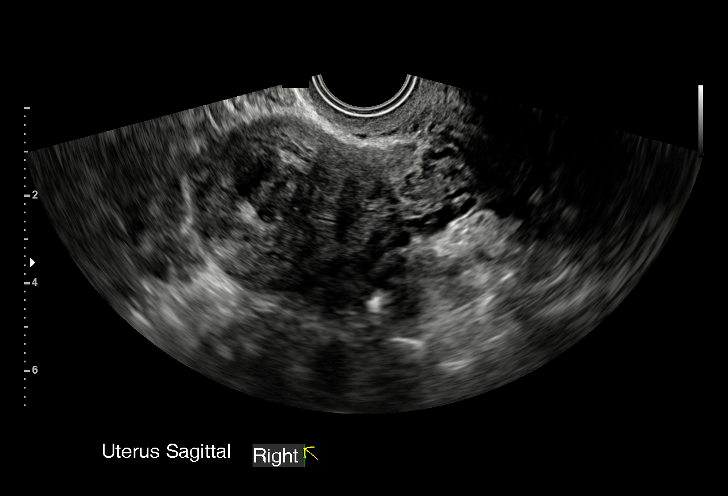
[im 33/63]
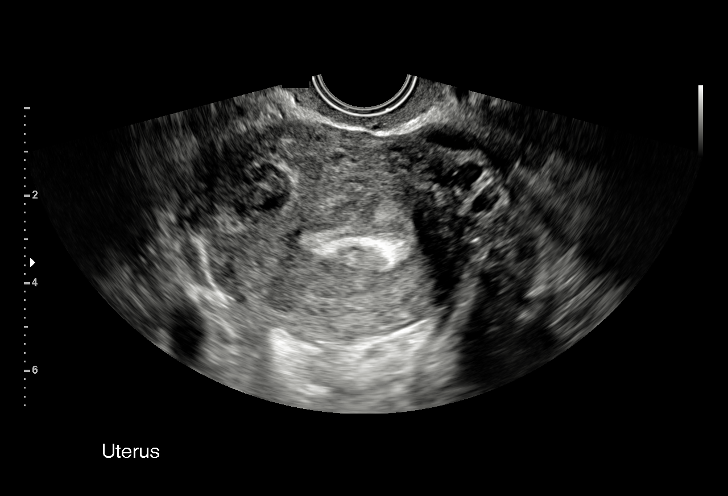
[im 35/63]
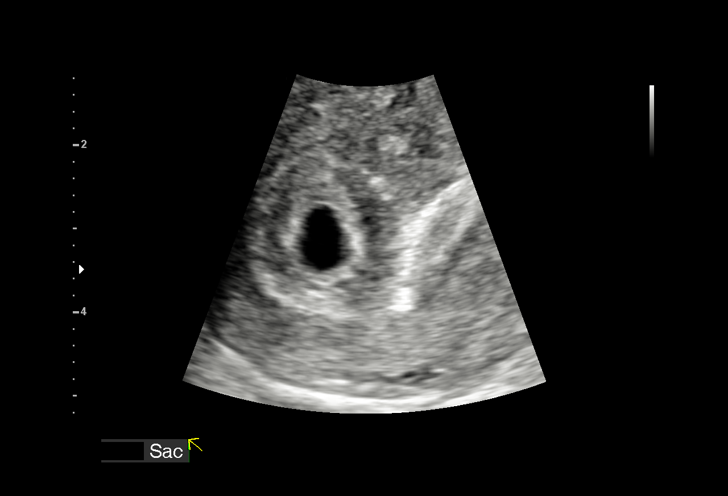
[im 40/63]
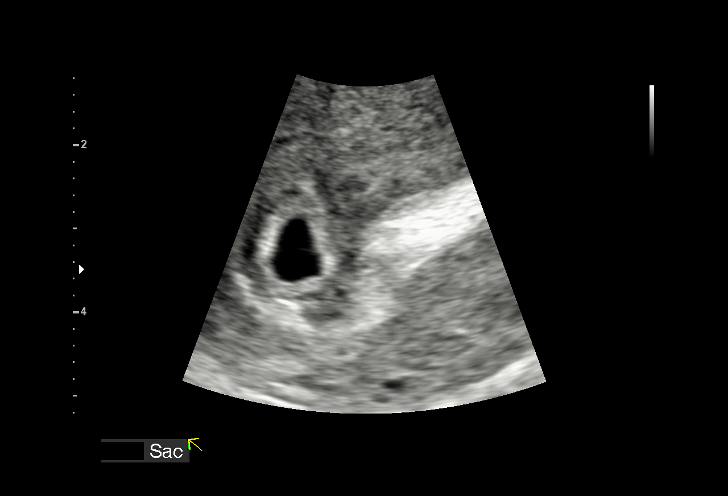
[im 44/63]
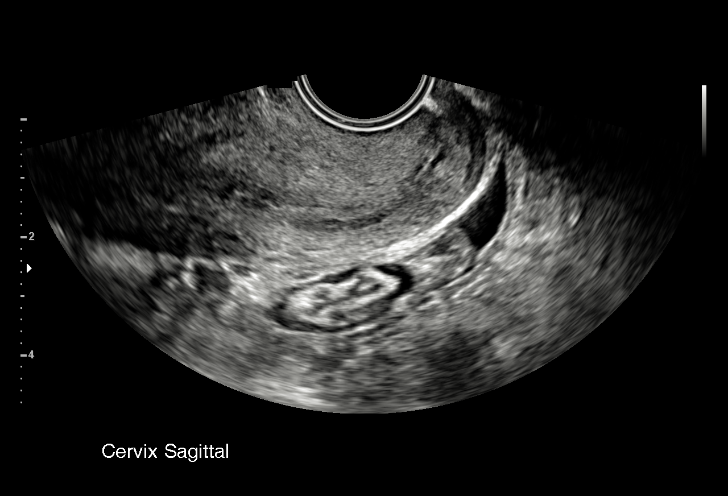
[im 49/63]
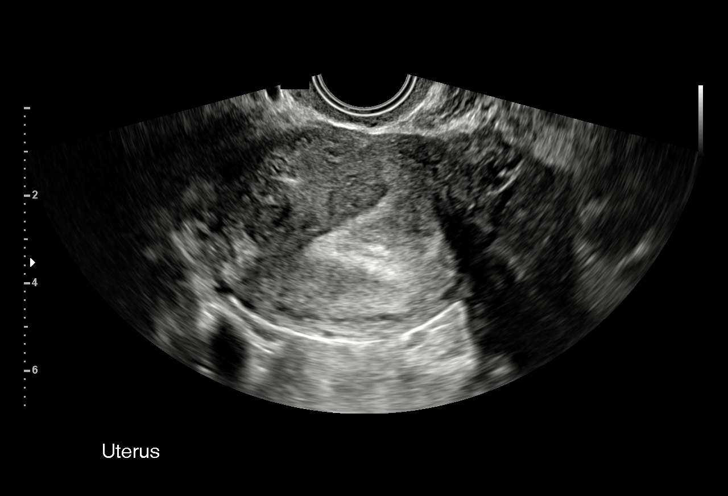
[im 53/63]
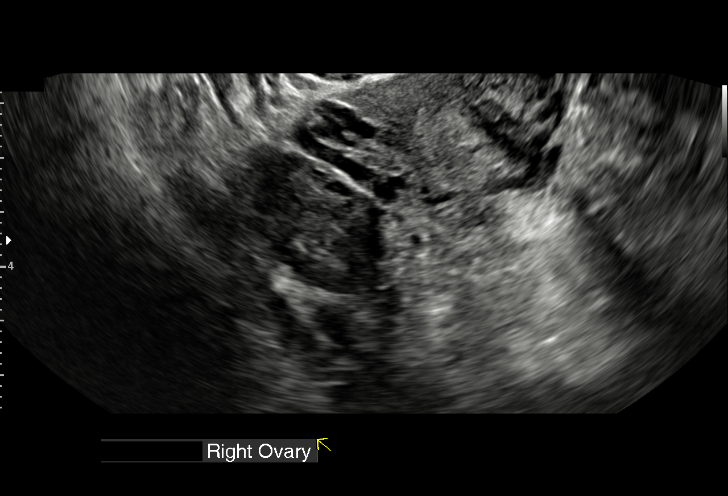
[im 58/63]
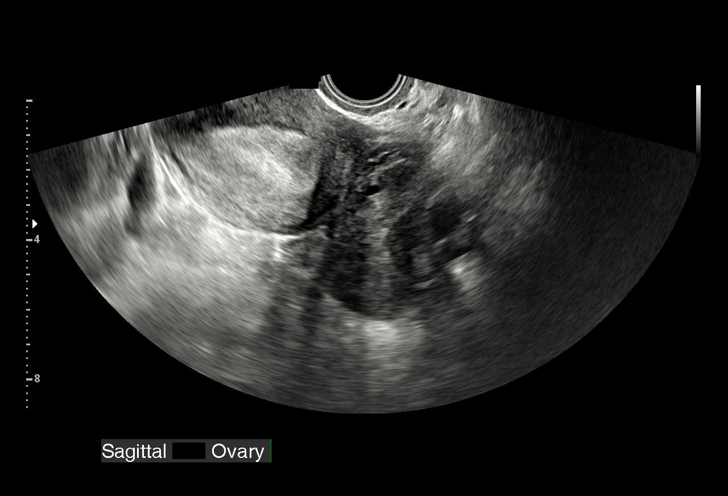
[im 63/63]
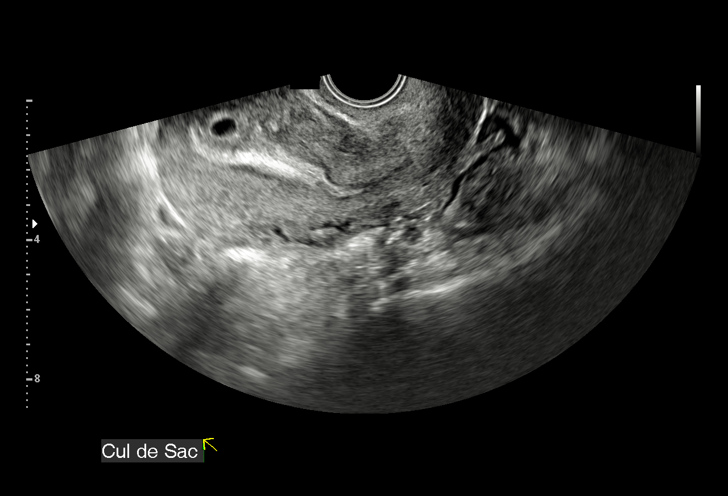

[15 of 28 positions shown; findings below may reference images not displayed]

FINDINGS: Intrauterine gestational sac: Single

Yolk sac:  Visualized

Embryo:  Not visualized

Cardiac Activity: Not visualized

Heart Rate:   bpm

MSD: 7.4  mm   5 w   3  d

CRL:    mm    w    d                  US EDC:

Subchorionic hemorrhage:  None visualized.

Maternal uterus/adnexae: No adnexal masses or free fluid. 2.3 cm
right fundal fibroid noted.
IMPRESSION: Five week 3 day intrauterine pregnancy based on mean sac diameter.
No yolk sac scratched at no fetal pole currently. This could be
followed with repeat ultrasound in 14 days to ensure expected
progression.

## 2019-08-24 ENCOUNTER — Telehealth: Payer: Medicaid Other | Admitting: Physician Assistant

## 2019-08-24 DIAGNOSIS — N760 Acute vaginitis: Secondary | ICD-10-CM | POA: Diagnosis not present

## 2019-08-24 MED ORDER — CLINDAMYCIN PHOSPHATE 2 % VA CREA: 1.0000 | TOPICAL_CREAM | Freq: Every day | VAGINAL | 0 refills | Status: DC

## 2019-08-24 MED ORDER — FLUCONAZOLE 150 MG PO TABS: 150.0000 mg | ORAL_TABLET | Freq: Once | ORAL | 0 refills | Status: AC

## 2019-08-24 NOTE — Progress Notes (Addendum)
We are sorry that you are not feeling well. Here is how we plan to help! Based on what you shared with me it looks like you: May have a yeast vaginosis  Vaginosis is an inflammation of the vagina that can result in discharge, itching and pain. The cause is usually a change in the normal balance of vaginal bacteria or an infection. Vaginosis can also result from reduced estrogen levels after menopause.  The most common causes of vaginosis are:   Bacterial vaginosis which results from an overgrowth of one on several organisms that are normally present in your vagina.   Yeast infections which are caused by a naturally occurring fungus called candida.   Vaginal atrophy (atrophic vaginosis) which results from the thinning of the vagina from reduced estrogen levels after menopause.   Trichomoniasis which is caused by a parasite and is commonly transmitted by sexual intercourse.  Factors that increase your risk of developing vaginosis include: Marland Kitchen Medications, such as antibiotics and steroids . Uncontrolled diabetes . Use of hygiene products such as bubble bath, vaginal spray or vaginal deodorant . Douching . Wearing damp or tight-fitting clothing . Using an intrauterine device (IUD) for birth control . Hormonal changes, such as those associated with pregnancy, birth control pills or menopause . Sexual activity . Having a sexually transmitted infection  Your treatment plan is Diflucan 150mg  tablet AND   Clindamycin vaginal cream 5 grams applied vaginally for 7 days.  I have electronically sent this prescription into the pharmacy that you have chosen.  Be sure to take all of the medication as directed. Stop taking any medication if you develop a rash, tongue swelling or shortness of breath. Mothers who are breast feeding should consider pumping and discarding their breast milk while on these antibiotics. However, there is no consensus that infant exposure at these doses would be harmful.  Remember  that medication creams can weaken latex condoms. Marland Kitchen PLEASE SEE YOUR PROVIDER If THIS DOES NOT HELP OR YOUR SYMPTOMS WORSEN AS YOU MAY NEED FURTHER TESTING OF YOUR URINE  HOME CARE:  Good hygiene may prevent some types of vaginosis from recurring and may relieve some symptoms:  . Avoid baths, hot tubs and whirlpool spas. Rinse soap from your outer genital area after a shower, and dry the area well to prevent irritation. Don't use scented or harsh soaps, such as those with deodorant or antibacterial action. Marland Kitchen Avoid irritants. These include scented tampons and pads. . Wipe from front to back after using the toilet. Doing so avoids spreading fecal bacteria to your vagina.  Other things that may help prevent vaginosis include:  Marland Kitchen Don't douche. Your vagina doesn't require cleansing other than normal bathing. Repetitive douching disrupts the normal organisms that reside in the vagina and can actually increase your risk of vaginal infection. Douching won't clear up a vaginal infection. . Use a latex condom. Both female and female latex condoms may help you avoid infections spread by sexual contact. . Wear cotton underwear. Also wear pantyhose with a cotton crotch. If you feel comfortable without it, skip wearing underwear to bed. Yeast thrives in [employer] Your symptoms should improve in the next day or two.  GET HELP RIGHT AWAY IF:  . You have pain in your lower abdomen ( pelvic area or over your ovaries) . You develop nausea or vomiting . You develop a fever . Your discharge changes or worsens . You have persistent pain with intercourse . You develop shortness of breath, a rapid  pulse, or you faint.  These symptoms could be signs of problems or infections that need to be evaluated by a medical provider now.  MAKE SURE YOU    Understand these instructions.  Will watch your condition.  Will get help right away if you are not doing well or get worse.   Your e-visit answers were  reviewed by a board certified advanced clinical practitioner to complete your personal care plan. Depending upon the condition, your plan could have included both over the counter or prescription medications. Please review your pharmacy choice to make sure that you have choses a pharmacy that is open for you to pick up any needed prescription, Your safety is important to [place]. If you have drug allergies check your prescription carefully.  You can use MyChart to ask questions about today's visit, request a non-urgent call back, or ask for a work or school excuse. You will get a MyChart message within the next two days asking about your experience. I hope that your e-visit has been valuable and will speed your recovery.    A total of 5 minutes was spent on this patient's chart

## 2019-09-28 ENCOUNTER — Telehealth: Payer: Medicaid Other | Admitting: Physician Assistant

## 2019-09-28 DIAGNOSIS — N760 Acute vaginitis: Secondary | ICD-10-CM

## 2019-09-28 NOTE — Progress Notes (Signed)
Based on what you shared with me, I feel your condition warrants further evaluation and I recommend that you be seen for a face to face office visit.  Giving ongoing/recurring symptoms despite prior treatment coupled with potential UTI as well and unsure of pregnancy status, you will need to be seen in person for an examination and to give at least a urine sample, potentially vaginal swabbing so that a formal diagnosis can be made and the most appropriate treatment given.   NOTE: If you entered your credit card information for this eVisit, you will not be charged. You may see a "hold" on your card for the $35 but that hold will drop off and you will not have a charge processed.  If you are having a true medical emergency please call 911.     For an urgent face to face visit, [name] has four urgent care centers for your convenience:   . [facility]    [phone]                  Get Driving Directions  [phone] [place], [place] [address] . 10 am to 8 pm Monday-Friday . 12 pm to 8 pm Saturday-Sunday   . [facility] at [place]                  Get Driving Directions  [phone] [place], [place], [place] [address] . 8 am to 8 pm Monday-Friday . 9 am to 6 pm Saturday . 11 am to 6 pm Sunday   . [facility] at [place]                  Get Driving Directions   [address], [place] [address] . 8 am to 8 pm Monday-Friday . 8 am to 4 pm Saturday-Sunday    . [facility] at [place]                    Get Driving Directions  [id number]  [address], [place], [place] [address]  . Monday-Friday, 12 PM to 6 PM    Your e-visit answers were reviewed by a board certified advanced clinical practitioner to complete your personal care plan.  Thank you for using e-Visits.

## 2020-08-04 ENCOUNTER — Other Ambulatory Visit: Payer: Self-pay

## 2020-08-04 DIAGNOSIS — N760 Acute vaginitis: Secondary | ICD-10-CM

## 2020-08-04 DIAGNOSIS — B9689 Other specified bacterial agents as the cause of diseases classified elsewhere: Secondary | ICD-10-CM

## 2020-08-12 NOTE — Telephone Encounter (Signed)
Encounter routed to incorrect location. Pt is seen at [facility]; routed to correct office.

## 2020-08-12 NOTE — Telephone Encounter (Signed)
metroNIDAZOLE (FLAGYL) 500 MG tablet       Sig: Take 1 tablet (500 mg total) by mouth 2 (two) times daily.   Disp:  14 tablet    Refills:  0   Start: 08/04/2020   Class: Normal   Non-formulary For: Bacterial vaginosis   Last ordered: 1 year ago by Shelly Bombard, MD      To be filled at: [id number] [place] [place]      Medication Refill  Cristal Ford, MD 6 days ago   Hi  Am I able to get my prescription refilled    Mortimer Fries S  Patient Medication Renewal Request Pool 8 days ago   Refills have been requested for the following medications:       metroNIDAZOLE (FLAGYL) 500 MG tablet Baltazar Najjar, MD]   Preferred pharmacy: [id number] [place], [place]

## 2020-08-13 MED ORDER — METRONIDAZOLE 500 MG PO TABS: 500.0000 mg | ORAL_TABLET | Freq: Two times a day (BID) | ORAL | 0 refills | Status: DC

## 2020-08-13 NOTE — Telephone Encounter (Signed)
Refill request from pharmacy    metroNIDAZOLE (FLAGYL) 500 MG tablet       Sig: Take 1 tablet (500 mg total) by mouth 2 (two) times daily.   Disp:  14 tablet    Refills:  0   Start: 08/04/2020   Class: Normal   Non-formulary For: Bacterial vaginosis   Last ordered: 1 year ago by Shelly Bombard, MD      To be filled at: [id number] [place], [place]

## 2020-12-20 ENCOUNTER — Emergency Department
Admission: EM | Admit: 2020-12-20 | Discharge: 2020-12-20 | Disposition: A | Discharge status: Home / Self Care | Payer: Medicaid Other | Attending: Emergency Medicine | Admitting: Emergency Medicine

## 2020-12-20 ENCOUNTER — Other Ambulatory Visit: Payer: Self-pay

## 2020-12-20 ENCOUNTER — Encounter: Payer: Self-pay | Admitting: Emergency Medicine

## 2020-12-20 DIAGNOSIS — F17210 Nicotine dependence, cigarettes, uncomplicated: Secondary | ICD-10-CM | POA: Insufficient documentation

## 2020-12-20 DIAGNOSIS — O039 Complete or unspecified spontaneous abortion without complication: Secondary | ICD-10-CM | POA: Diagnosis present

## 2020-12-20 LAB — CBC WITH DIFFERENTIAL/PLATELET
Abs Immature Granulocytes: 0.02 10*3/uL (ref 0.00–0.07)
Basophils Absolute: 0 10*3/uL (ref 0.0–0.1)
Basophils Relative: 0 %
Eosinophils Absolute: 0.1 10*3/uL (ref 0.0–0.5)
Eosinophils Relative: 1 %
HCT: 40.5 % (ref 36.0–46.0)
Hemoglobin: 12.7 g/dL (ref 12.0–15.0)
Immature Granulocytes: 0 %
Lymphocytes Relative: 23 %
Lymphs Abs: 1.9 10*3/uL (ref 0.7–4.0)
MCH: 25.9 pg — ABNORMAL LOW (ref 26.0–34.0)
MCHC: 31.4 g/dL (ref 30.0–36.0)
MCV: 82.5 fL (ref 80.0–100.0)
Monocytes Absolute: 0.7 10*3/uL (ref 0.1–1.0)
Monocytes Relative: 8 %
Neutro Abs: 5.6 10*3/uL (ref 1.7–7.7)
Neutrophils Relative %: 68 %
Platelets: 297 10*3/uL (ref 150–400)
RBC: 4.91 MIL/uL (ref 3.87–5.11)
RDW: 13.6 % (ref 11.5–15.5)
WBC: 8.3 10*3/uL (ref 4.0–10.5)
nRBC: 0 % (ref 0.0–0.2)

## 2020-12-20 LAB — BASIC METABOLIC PANEL
Anion gap: 10 (ref 5–15)
BUN: 6 mg/dL (ref 6–20)
CO2: 26 mmol/L (ref 22–32)
Calcium: 9.6 mg/dL (ref 8.9–10.3)
Chloride: 102 mmol/L (ref 98–111)
Creatinine, Ser: 0.79 mg/dL (ref 0.44–1.00)
GFR, Estimated: >60 mL/min (ref >60)
Glucose, Bld: 91 mg/dL (ref 70–99)
Potassium: 3.8 mmol/L (ref 3.5–5.1)
Sodium: 138 mmol/L (ref 135–145)

## 2020-12-20 LAB — POC URINE PREG, ED: Preg Test, Ur: NEGATIVE

## 2020-12-20 LAB — HCG, QUANTITATIVE, PREGNANCY: hCG, Beta Chain, Quant, S: <1 m[IU]/mL (ref <5)

## 2020-12-20 LAB — ABO/RH: ABO/RH(D): A POS

## 2020-12-20 NOTE — ED Triage Notes (Signed)
Pt to ED via POV stating that she thinks she is miscarrying. Pt reports LMP on 11/22. Pt states that she had a + pregnancy test at home 4 days ago. About 1 hour PTA pt had sudden onset abdominal cramping and vaginal bleeding. Pt states that the bleeding stopped but when she was giving her urine sample she passed a large clot. Pt is in NAD.

## 2020-12-20 NOTE — ED Provider Notes (Addendum)
[facility] Emergency Department Provider Note  ____________________________________________   Event Date/Time   First MD Initiated Contact with Patient 12/20/20 1527     (approximate)  I have reviewed the triage vital signs and the nursing notes.   HISTORY  Chief Complaint Abdominal Pain and Vaginal Bleeding    HPI Brianna Jimenez is a 35 y.o. female with history of miscarriage who comes in with concerns for miscarriage.  Patient states that she had 1 ectopic and 2 miscarriages in the past.  Patient states that today she is concerned that she had a miscarriage.  She states that she had her last LMP 11/22.  She states that she took a home pregnancy test about 4 days ago that was positive.  Prior to arrival she had sudden onset of abdominal cramping and vaginal bleeding.  Since then the abdominal pain and the bleeding has stopped.  She states that when she urinated there was a large clot that passed.  At this time she is in no pain and no longer having any vaginal bleeding.  Abdominal cramping was mild, constant, now resolved, nothing made it better or worse          Past Medical History:  Diagnosis Date  . Chlamydia   . Cholecystitis, unspecified   . Shoulder dystocia during labor and delivery, delivered 06/01/2018   30sec resolved w/ [facility]  . Trichomonas     Patient Active Problem List   Diagnosis Date Noted  . Vitamin D deficiency 11/19/2017  . History of ectopic pregnancy 11/15/2017    Past Surgical History:  Procedure Laterality Date  . NO PAST SURGERIES      Prior to Admission medications   Medication Sig Start Date End Date Taking? Authorizing Provider  clindamycin (CLEOCIN) 2 % vaginal cream Place 1 Applicatorful vaginally at bedtime. 08/24/19   Alveria Apley, PA-C  metroNIDAZOLE (FLAGYL) 500 MG tablet Take 1 tablet (500 mg total) by mouth 2 (two) times daily. 08/13/20   Shelly Bombard, MD    Allergies Patient has no known  allergies.  No family history on file.  Social History Social History   Tobacco Use  . Smoking status: Current Every Day Smoker    Packs/day: 0.50    Types: Cigarettes  . Smokeless tobacco: Current User  Substance Use Topics  . Alcohol use: Yes    Comment: occasional  . Drug use: Yes    Frequency: 1.0 times per week    Types: Marijuana      Review of Systems Constitutional: No fever/chills Eyes: No visual changes. ENT: No sore throat. Cardiovascular: Denies chest pain. Respiratory: Denies shortness of breath. Gastrointestinal: Abdominal cramping no nausea, no vomiting.  No diarrhea.  No constipation. Genitourinary: Negative for dysuria.  Vaginal bleeding Musculoskeletal: Negative for back pain. Skin: Negative for rash. Neurological: Negative for headaches, focal weakness or numbness. All other ROS negative ____________________________________________   PHYSICAL EXAM:  VITAL SIGNS: ED Triage Vitals  Enc Vitals Group     BP 12/20/20 1409 119/68     Pulse Rate 12/20/20 1431 71     Resp 12/20/20 1409 16     Temp 12/20/20 1409 98.4 F (36.9 C)     Temp src --      SpO2 12/20/20 1409 100 %     Weight 12/20/20 1429 146 lb (66.2 kg)     Height 12/20/20 1429 5\' 5"  (1.651 m)     Head Circumference --  Peak Flow --      Pain Score 12/20/20 1429 1     Pain Loc --      Pain Edu? --      Excl. in [place]? --     Constitutional: Alert and oriented. Well appearing and in no acute distress. Eyes: Conjunctivae are normal. EOMI. Head: Atraumatic. Nose: No congestion/rhinnorhea. Mouth/Throat: Mucous membranes are moist.   Neck: No stridor. Trachea Midline. FROM Cardiovascular: Normal rate, regular rhythm. Grossly normal heart sounds.  Good peripheral circulation. Respiratory: Normal respiratory effort.  No retractions. Lungs CTAB. Gastrointestinal: Soft and nontender. No distention. No abdominal bruits.  Musculoskeletal: No lower extremity tenderness nor edema.  No  joint effusions. Neurologic:  Normal speech and language. No gross focal neurologic deficits are appreciated.  Skin:  Skin is warm, dry and intact. No rash noted. Psychiatric: Mood and affect are normal. Speech and behavior are normal. GU: Deferred   ____________________________________________   LABS (all labs ordered are listed, but only abnormal results are displayed)  Labs Reviewed  CBC WITH DIFFERENTIAL/PLATELET - Abnormal; Notable for the following components:      Result Value   MCH 25.9 (*)    All other components within normal limits  BASIC METABOLIC PANEL  HCG, QUANTITATIVE, PREGNANCY  POC URINE PREG, ED  ABO/RH   ____________________________________________     INITIAL IMPRESSION / ASSESSMENT AND PLAN / ED COURSE  Brianna Jimenez was evaluated in Emergency Department on 34/06/2021 for the symptoms described in the history of present illness. She was evaluated in the context of the global COVID-19 pandemic, which necessitated consideration that the patient might be at risk for infection with the SARS-CoV-2 virus that causes COVID-19. Institutional protocols and algorithms that pertain to the evaluation of patients at risk for COVID-19 are in a state of rapid change based on information released by regulatory bodies including the CDC and federal and state organizations. These policies and algorithms were followed during the patient's care in the ED.     Patient is a well-appearing 35 year old who comes in with concerns for miscarriage.  We will get hCG to evaluate for pregnancy.  hCG level was 0.  Given this I have low suspicion for ectopic.  This most likely represents a complete miscarriage.  She is not septic appearing to suggest septic abortion.  She has no longer had any abdominal pain to suggest ovarian torsion.  She declined pelvic exam at this time.  Discussed with patient given her recurrent miscarriages she should follow-up with OB/GYN for fertility work-up if she  wants to try to get pregnant again.  RH POSITIVE no rhogam needed.   I discussed the provisional nature of ED diagnosis, the treatment so far, the ongoing plan of care, follow up appointments and return precautions with the patient and any family or support people present. They expressed understanding and agreed with the plan, discharged home.        ____________________________________________   FINAL CLINICAL IMPRESSION(S) / ED DIAGNOSES   Final diagnoses:  Miscarriage      MEDICATIONS GIVEN DURING THIS VISIT:  Medications - No data to display   ED Discharge Orders    None       Note:  This document was prepared using Dragon voice recognition software and may include unintentional dictation errors.   Vanessa Jensen, MD 12/20/20 1552    Vanessa Clovis, MD 12/20/20 [phone]

## 2020-12-20 NOTE — ED Notes (Signed)
Pt d/c at this time, patient verbalizes d/c instructions with no further questions at this time. Pt unable to sign.

## 2021-07-04 ENCOUNTER — Telehealth: Payer: Medicaid Other | Admitting: Physician Assistant

## 2021-07-04 ENCOUNTER — Encounter: Payer: Self-pay | Admitting: Physician Assistant

## 2021-07-04 ENCOUNTER — Other Ambulatory Visit (INDEPENDENT_AMBULATORY_CARE_PROVIDER_SITE_OTHER): Payer: Self-pay | Admitting: Physician Assistant

## 2021-07-04 DIAGNOSIS — K047 Periapical abscess without sinus: Secondary | ICD-10-CM | POA: Diagnosis not present

## 2021-07-04 MED ORDER — LIDOCAINE VISCOUS HCL 2 % MT SOLN: 15.0000 mL | OROMUCOSAL | 0 refills | Status: AC | PRN

## 2021-07-04 MED ORDER — AMOXICILLIN 500 MG PO CAPS: 500.0000 mg | ORAL_CAPSULE | Freq: Three times a day (TID) | ORAL | 0 refills | Status: DC

## 2021-07-04 MED ORDER — AMOXICILLIN 500 MG PO CAPS: 500.0000 mg | ORAL_CAPSULE | Freq: Three times a day (TID) | ORAL | 0 refills | Status: AC

## 2021-07-04 NOTE — Patient Instructions (Signed)
Brianna Jimenez, thank you for joining Mar Daring, PA-C for today's virtual visit.  While this provider is not your primary care provider (PCP), if your PCP is located in our provider database this encounter information will be shared with them immediately following your visit.  Consent: (Patient) Brianna Jimenez provided verbal consent for this virtual visit at the beginning of the encounter.  Current Medications:  Current Outpatient Medications:    amoxicillin (AMOXIL) 500 MG capsule, Take 1 capsule (500 mg total) by mouth 3 (three) times daily for 10 days., Disp: 30 capsule, Rfl: 0   lidocaine (XYLOCAINE) 2 % solution, Use as directed 15 mLs in the mouth or throat as needed for mouth pain., Disp: 200 mL, Rfl: 0   clindamycin (CLEOCIN) 2 % vaginal cream, Place 1 Applicatorful vaginally at bedtime., Disp: 40 g, Rfl: 0   metroNIDAZOLE (FLAGYL) 500 MG tablet, Take 1 tablet (500 mg total) by mouth 2 (two) times daily., Disp: 14 tablet, Rfl: 0   Medications ordered in this encounter:  Meds ordered this encounter  Medications   amoxicillin (AMOXIL) 500 MG capsule    Sig: Take 1 capsule (500 mg total) by mouth 3 (three) times daily for 10 days.    Dispense:  30 capsule    Refill:  0    Order Specific Question:   Supervising Provider    Answer:   MILLER, BRIAN [[id number]]   lidocaine (XYLOCAINE) 2 % solution    Sig: Use as directed 15 mLs in the mouth or throat as needed for mouth pain.    Dispense:  200 mL    Refill:  0    Order Specific Question:   Supervising Provider    Answer:   Sabra Heck, BRIAN [[id number]]     *If you need refills on other medications prior to your next appointment, please contact your pharmacy*  Follow-Up: Call back or seek an in-person evaluation if the symptoms worsen or if the condition fails to improve as anticipated.  Other Instructions [facility] [phone]  [facility] [phone]  [facility]  [phone]  [facility] [phone]  If you have been instructed to have an in-person evaluation today at a local Urgent Care facility, please use the link below. It will take you to a list of all of our available [facility], including address, phone number and hours of operation. Please do not delay care.  [facility]  If you or a family member do not have a primary care provider, use the link below to schedule a visit and establish care. When you choose a [facility] primary care physician or advanced practice provider, you gain a long-term partner in health. Find a Primary Care Provider  Learn more about [facility]'s in-office and virtual care options: [facility]  Dental Abscess  A dental abscess is an area of pus in or around a tooth. It comes from an infection. It can cause pain and other symptoms. Treatment will help withsymptoms and prevent the infection from spreading. Follow these instructions at home: Medicines Take over-the-counter and prescription medicines only as told by your dentist. If you were prescribed an antibiotic medicine, take it as told by your dentist. Do not stop taking it even if you start to feel better. If you were prescribed a gel that has numbing medicine in it, use it exactly as told. Do not drive or use heavy machinery (like a Conservation officer, nature) while taking  prescription pain medicine. General instructions  Rinse out your mouth often with salt water. To make salt water, dissolve -1 tsp of salt in 1 cup of warm water. Eat a soft diet while your mouth is healing. Drink enough fluid to keep your urine pale yellow. Do not apply heat to the outside of your mouth. Do not use any products that contain nicotine or tobacco. These include cigarettes and e-cigarettes. If you need help quitting, ask your doctor. Keep all follow-up visits as told by your dentist. This is important.  Prevent an abscess Brush your  teeth every morning and every night. Use fluoride toothpaste. Floss your teeth each day. Get dental cleanings as often as told by your dentist. Think about getting dental sealant put on teeth that have deep holes (decay). Drink water that has fluoride in it. Most tap water has fluoride. Check the label on bottled water to see if it has fluoride in it. Drink water instead of sugary drinks. Eat healthy meals and snacks. Wear a mouth guard or face shield when you play sports. Contact a doctor if: Your pain is worse, and medicine does not help. Get help right away if: You have a fever or chills. Your symptoms suddenly get worse. You have a very bad headache. You have problems breathing or swallowing. You have trouble opening your mouth. You have swelling in your neck or close to your eye. Summary A dental abscess is an area of pus in or around a tooth. It is caused by an infection. Treatment will help with symptoms and prevent the infection from spreading. Take over-the-counter and prescription medicines only as told by your dentist. To prevent an abscess, take good care of your teeth. Brush your teeth every morning and night. Use floss every day. Get dental cleanings as often as told by your dentist. This information is not intended to replace advice given to you by your health care provider. Make sure you discuss any questions you have with your healthcare provider. Document Revised: 05/10/2020 Document Reviewed: 06/21/2020 Elsevier Patient Education  [place].

## 2021-07-04 NOTE — Progress Notes (Signed)
Virtual Visit Consent   Brianna Jimenez, you are scheduled for a virtual visit with a [facility] provider today.     Just as with appointments in the office, your consent must be obtained to participate.  Your consent will be active for this visit and any virtual visit you may have with one of our providers in the next 365 days.     If you have a MyChart account, a copy of this consent can be sent to you electronically.  All virtual visits are billed to your insurance company just like a traditional visit in the office.    As this is a virtual visit, video technology does not allow for your provider to perform a traditional examination.  This may limit your provider's ability to fully assess your condition.  If your provider identifies any concerns that need to be evaluated in person or the need to arrange testing (such as labs, EKG, etc.), we will make arrangements to do so.     Although advances in technology are sophisticated, we cannot ensure that it will always work on either your end or our end.  If the connection with a video visit is poor, the visit may have to be switched to a telephone visit.  With either a video or telephone visit, we are not always able to ensure that we have a secure connection.     I need to obtain your verbal consent now.   Are you willing to proceed with your visit today?    Brianna Jimenez has provided verbal consent on 07/04/2021 for a virtual visit (video or telephone).   Mar Daring, PA-C   Date: 07/04/2021 9:49 AM   Virtual Visit via Video Note   I, Mar Daring, connected with  Brianna Jimenez  ([id number], 29-Sep-1986) on 07/04/21 at  8:45 AM EDT by a video-enabled telemedicine application and verified that I am speaking with the correct person using two identifiers.  Location: Patient: Virtual Visit Location Patient: Home Provider: Virtual Visit Location Provider: Home Office   I discussed the limitations of evaluation and  management by telemedicine and the availability of in person appointments. The patient expressed understanding and agreed to proceed.    History of Present Illness: Brianna Jimenez is a 35 y.o. who identifies as a female who was assigned female at birth, and is being seen today for abscess in mouth around tooth, right lower. Reports has been hurting for a few days. Woke up this morning and had swollen right jaw. Denies fevers, chills, nausea, or vomiting.  HPI: HPI  Problems:  Patient Active Problem List   Diagnosis Date Noted   Vitamin D deficiency 11/19/2017   History of ectopic pregnancy 11/15/2017    Allergies: No Known Allergies Medications:  Current Outpatient Medications:    amoxicillin (AMOXIL) 500 MG capsule, Take 1 capsule (500 mg total) by mouth 3 (three) times daily for 10 days., Disp: 30 capsule, Rfl: 0   lidocaine (XYLOCAINE) 2 % solution, Use as directed 15 mLs in the mouth or throat as needed for mouth pain., Disp: 200 mL, Rfl: 0   clindamycin (CLEOCIN) 2 % vaginal cream, Place 1 Applicatorful vaginally at bedtime., Disp: 40 g, Rfl: 0   metroNIDAZOLE (FLAGYL) 500 MG tablet, Take 1 tablet (500 mg total) by mouth 2 (two) times daily., Disp: 14 tablet, Rfl: 0  Observations/Objective: Patient is well-developed, well-nourished in no acute distress.  Resting comfortably at home.  Head is normocephalic, atraumatic.  No labored breathing.  Speech is clear and coherent with logical content.  Patient is alert and oriented at baseline.  Right lower jaw obviously swollen  Assessment and Plan: 1. Tooth abscess - amoxicillin (AMOXIL) 500 MG capsule; Take 1 capsule (500 mg total) by mouth 3 (three) times daily for 10 days.  Dispense: 30 capsule; Refill: 0 - lidocaine (XYLOCAINE) 2 % solution; Use as directed 15 mLs in the mouth or throat as needed for mouth pain.  Dispense: 200 mL; Refill: 0 - Amoxicillin for infection - Viscous lidocaine for pain - Tylenol and Ibuprofen  alternating for pain and fevers - Ice  - Seek in person evaluation with dentist  Follow Up Instructions: I discussed the assessment and treatment plan with the patient. The patient was provided an opportunity to ask questions and all were answered. The patient agreed with the plan and demonstrated an understanding of the instructions.  A copy of instructions were sent to the patient via MyChart.  The patient was advised to call back or seek an in-person evaluation if the symptoms worsen or if the condition fails to improve as anticipated.  Time:  I spent 8 minutes with the patient via telehealth technology discussing the above problems/concerns.    Mar Daring, PA-C

## 2021-10-31 ENCOUNTER — Telehealth: Payer: Medicaid Other | Admitting: Nurse Practitioner

## 2021-10-31 DIAGNOSIS — R109 Unspecified abdominal pain: Secondary | ICD-10-CM

## 2021-10-31 DIAGNOSIS — R399 Unspecified symptoms and signs involving the genitourinary system: Secondary | ICD-10-CM

## 2021-11-01 NOTE — Progress Notes (Signed)
Based on what you shared with me it looks like you have back pain and urinary problems.,that should be evaluated in a face to face office visit. You need a fac to face visit with urinalysis, possibly urine culture as well as possible scan for proper treatment.  NOTE: There will be NO CHARGE for this eVisit   If you are having a true medical emergency please call 911.      For an urgent face to face visit, [facility] has six urgent care centers for your convenience:     [facility] [place] [facility] [place] Get Driving Directions [phone] [place], [place] [address]    [facility]) Get Driving Directions [phone] [place], [place] [address]  [facility] [place] [facility]) Get Driving Directions [phone] [address] [place],  [place]  [address]  [facility] Get Driving Directions [phone] [address] [place], [place], [place] [address]   [facility] Get Driving Directions  [phone] [address] [place], [place] [address]   [facility] [place] Get Driving Directions [phone] [address], [place], [place] [address]  Your MyChart E-visit questionnaire answers were reviewed by a board certified advanced clinical practitioner to complete your personal care plan based on your specific symptoms.  Thank you for using e-Visits.

## 2022-01-07 ENCOUNTER — Ambulatory Visit (HOSPITAL_COMMUNITY): Payer: Medicaid Other

## 2022-01-25 ENCOUNTER — Telehealth: Payer: Medicaid Other | Admitting: Physician Assistant

## 2022-01-25 ENCOUNTER — Encounter: Payer: Self-pay | Admitting: Physician Assistant

## 2022-01-25 DIAGNOSIS — K047 Periapical abscess without sinus: Secondary | ICD-10-CM

## 2022-01-25 MED ORDER — AMOXICILLIN 500 MG PO CAPS: 500.0000 mg | ORAL_CAPSULE | Freq: Three times a day (TID) | ORAL | 0 refills | Status: AC

## 2022-01-25 MED ORDER — IBUPROFEN 800 MG PO TABS: 800.0000 mg | ORAL_TABLET | Freq: Three times a day (TID) | ORAL | 0 refills | Status: AC | PRN

## 2022-01-25 NOTE — Patient Instructions (Signed)
Brianna Jimenez, thank you for joining Mar Daring, PA-C for today's virtual visit.  While this provider is not your primary care provider (PCP), if your PCP is located in our provider database this encounter information will be shared with them immediately following your visit.  Consent: (Patient) Brianna Jimenez provided verbal consent for this virtual visit at the beginning of the encounter.  Current Medications:  Current Outpatient Medications:    amoxicillin (AMOXIL) 500 MG capsule, Take 1 capsule (500 mg total) by mouth 3 (three) times daily for 10 days., Disp: 30 capsule, Rfl: 0   ibuprofen (ADVIL) 800 MG tablet, Take 1 tablet (800 mg total) by mouth every 8 (eight) hours as needed., Disp: 30 tablet, Rfl: 0   clindamycin (CLEOCIN) 2 % vaginal cream, Place 1 Applicatorful vaginally at bedtime., Disp: 40 g, Rfl: 0   lidocaine (XYLOCAINE) 2 % solution, Use as directed 15 mLs in the mouth or throat as needed for mouth pain., Disp: 200 mL, Rfl: 0   metroNIDAZOLE (FLAGYL) 500 MG tablet, Take 1 tablet (500 mg total) by mouth 2 (two) times daily., Disp: 14 tablet, Rfl: 0   Medications ordered in this encounter:  Meds ordered this encounter  Medications   amoxicillin (AMOXIL) 500 MG capsule    Sig: Take 1 capsule (500 mg total) by mouth 3 (three) times daily for 10 days.    Dispense:  30 capsule    Refill:  0    Order Specific Question:   Supervising Provider    Answer:   MILLER, BRIAN [[id number]]   ibuprofen (ADVIL) 800 MG tablet    Sig: Take 1 tablet (800 mg total) by mouth every 8 (eight) hours as needed.    Dispense:  30 tablet    Refill:  0    Order Specific Question:   Supervising Provider    Answer:   Sabra Heck, BRIAN [[id number]]     *If you need refills on other medications prior to your next appointment, please contact your pharmacy*  Follow-Up: Call back or seek an in-person evaluation if the symptoms worsen or if the condition fails to improve as anticipated.  Other  Instructions Dental Abscess A dental abscess is an infection around a tooth that may involve pain, swelling, and a collection of pus, as well as other symptoms. Treatment is important to help with symptoms and to prevent the infection from spreading. The general types of dental abscesses are: Pulpal abscess. This abscess may form from the inner part of the tooth (pulp). Periodontal abscess. This abscess may form from the gum. What are the causes? This condition is caused by a bacterial infection in or around the tooth. It may result from: Severe tooth decay (cavities). Trauma to the tooth, such as a broken or chipped tooth. What increases the risk? This condition is more likely to develop in males. It is also more likely to develop in people who: Have cavities. Have severe gum disease. Eat sugary snacks between meals. Use tobacco products. Have diabetes. Have a weakened disease-fighting system (immune system). Do not brush and care for their teeth regularly. What are the signs or symptoms? Mild symptoms of this condition include: Tenderness. Bad breath. Fever. A bitter taste in the mouth. Pain in and around the infected tooth. Moderate symptoms of this condition include: Swollen neck glands. Chills. Pus drainage. Swelling and redness around the infected tooth, in the mouth, or in the face. Severe pain in and around the infected tooth. Severe symptoms of this  condition include: Difficulty swallowing. Difficulty opening the mouth. Nausea. Vomiting. How is this diagnosed? This condition is diagnosed based on: Your symptoms and your medical and dental history. An examination of the infected tooth. During the exam, your dental care provider may tap on the infected tooth. You may also need to have X-rays taken of the affected area. How is this treated? This condition is treated by getting rid of the infection. This may be done with: Antibiotic medicines. These may be used in  certain situations. Antibacterial mouth rinse. Incision and drainage. This procedure is done by making an incision in the abscess to drain out the pus. Removing pus is the first priority in treating an abscess. A root canal. This may be performed to save the tooth. Your dental care provider accesses the visible part of your tooth (crown) with a drill and removes any infected pulp. Then the space is filled and sealed off. Tooth extraction. The tooth is pulled out if it cannot be saved by other treatment. You may also receive treatment for pain, such as: Acetaminophen or NSAIDs. Gels that contain a numbing medicine. An injection to block the pain near your nerve. Follow these instructions at home: Medicines Take over-the-counter and prescription medicines only as told by your dental care provider. If you were prescribed an antibiotic, take it as told by your dental care provider. Do not stop taking the antibiotic even if you start to feel better. If you were prescribed a gel that contains a numbing medicine, use it exactly as told in the directions. Do not use these gels for children who are younger than 1 years of age. Use an antibacterial mouth rinse as told by your dental care provider. General instructions  Gargle with a mixture of salt and water 3-4 times a day or as needed. To make salt water, completely dissolve -1 tsp (3-6 g) of salt in 1 cup (237 mL) of warm water. Eat a soft diet while your abscess is healing. Drink enough fluid to keep your urine pale yellow. Do not apply heat to the outside of your mouth. Do not use any products that contain nicotine or tobacco. These products include cigarettes, chewing tobacco, and vaping devices, such as e-cigarettes. If you need help quitting, ask your dental care provider. Keep all follow-up visits. This is important. How is this prevented?  Excellent dental home care, which includes brushing your teeth every morning and night with fluoride  toothpaste. Floss one time each day. Get regularly scheduled dental cleanings. Consider having a dental sealant applied on teeth that have deep grooves to prevent cavities. Drink fluoridated water regularly. This includes most tap water. Check the label on bottled water to see if it contains fluoride. Reduce or eliminate sugary drinks. Eat healthy meals and snacks. Wear a mouth guard or face shield to protect your teeth while playing sports. Contact a health care provider if: Your pain is worse and is not helped by medicine. You have swelling. You see pus around the tooth. You have a fever or chills. Get help right away if: Your symptoms suddenly get worse. You have a very bad headache. You have problems breathing or swallowing. You have trouble opening your mouth. You have swelling in your neck or around your eye. These symptoms may represent a serious problem that is an emergency. Do not wait to see if the symptoms will go away. Get medical help right away. Call your local emergency services (911 in the [place] Do not drive yourself  to the hospital. Summary A dental abscess is a collection of pus in or around a tooth that results from an infection. A dental abscess may result from severe tooth decay, trauma to the tooth, or severe gum disease around a tooth. Symptoms include severe pain, swelling, redness, and drainage of pus in and around the infected tooth. The first priority in treating a dental abscess is to drain out the pus. Treatment may also involve removing damage inside the tooth (root canal) or extracting the tooth. This information is not intended to replace advice given to you by your health care provider. Make sure you discuss any questions you have with your health care provider. Document Revised: 02/06/2021 Document Reviewed: 02/06/2021 Elsevier Patient Education  2022 [name].    If you have been instructed to have an in-person evaluation today at a local Urgent  Care facility, please use the link below. It will take you to a list of all of our available [facility], including address, phone number and hours of operation. Please do not delay care.  [facility]  If you or a family member do not have a primary care provider, use the link below to schedule a visit and establish care. When you choose a [facility] primary care physician or advanced practice provider, you gain a long-term partner in health. Find a Primary Care Provider  Learn more about [facility] in-office and virtual care options: [facility]

## 2022-01-25 NOTE — Progress Notes (Signed)
Virtual Visit Consent   Brianna Jimenez, you are scheduled for a virtual visit with a [facility] provider today.     Just as with appointments in the office, your consent must be obtained to participate.  Your consent will be active for this visit and any virtual visit you may have with one of our providers in the next 365 days.     If you have a MyChart account, a copy of this consent can be sent to you electronically.  All virtual visits are billed to your insurance company just like a traditional visit in the office.    As this is a virtual visit, video technology does not allow for your provider to perform a traditional examination.  This may limit your provider's ability to fully assess your condition.  If your provider identifies any concerns that need to be evaluated in person or the need to arrange testing (such as labs, EKG, etc.), we will make arrangements to do so.     Although advances in technology are sophisticated, we cannot ensure that it will always work on either your end or our end.  If the connection with a video visit is poor, the visit may have to be switched to a telephone visit.  With either a video or telephone visit, we are not always able to ensure that we have a secure connection.     I need to obtain your verbal consent now.   Are you willing to proceed with your visit today?    Brianna Jimenez has provided verbal consent on 01/25/2022 for a virtual visit (video or telephone).   Mar Daring, PA-C   Date: 01/25/2022 8:37 AM   Virtual Visit via Video Note   I, Mar Daring, connected with  Brianna Jimenez  ([id number], [id number]) on 01/25/22 at  8:30 AM EST by a video-enabled telemedicine application and verified that I am speaking with the correct person using two identifiers.  Location: Patient: Virtual Visit Location Patient: Home Provider: Virtual Visit Location Provider: Home Office   I discussed the limitations of evaluation and  management by telemedicine and the availability of in person appointments. The patient expressed understanding and agreed to proceed.    History of Present Illness: Brianna Jimenez is a 36 y.o. who identifies as a female who was assigned female at birth, and is being seen today for dental pain, possible infection.  HPI: Dental Pain  This is a new problem. Episode onset: swollen on left side of face; has a broken wisdom tooth on the left; symptoms started over the last week - 3-4 days ago. The problem occurs constantly. The problem has been gradually worsening. The pain is severe. Associated symptoms include facial pain and thermal sensitivity. Pertinent negatives include no fever. Associated symptoms comments: Facial swelling on left. She has tried NSAIDs for the symptoms. The treatment provided no relief.   Has appt with dentist tomorrow for emergency pull  Problems:  Patient Active Problem List   Diagnosis Date Noted   Vitamin D deficiency 11/19/2017   History of ectopic pregnancy 11/15/2017    Allergies: No Known Allergies Medications:  Current Outpatient Medications:    amoxicillin (AMOXIL) 500 MG capsule, Take 1 capsule (500 mg total) by mouth 3 (three) times daily for 10 days., Disp: 30 capsule, Rfl: 0   ibuprofen (ADVIL) 800 MG tablet, Take 1 tablet (800 mg total) by mouth every 8 (eight) hours as needed., Disp: 30 tablet, Rfl: 0  clindamycin (CLEOCIN) 2 % vaginal cream, Place 1 Applicatorful vaginally at bedtime., Disp: 40 g, Rfl: 0   lidocaine (XYLOCAINE) 2 % solution, Use as directed 15 mLs in the mouth or throat as needed for mouth pain., Disp: 200 mL, Rfl: 0   metroNIDAZOLE (FLAGYL) 500 MG tablet, Take 1 tablet (500 mg total) by mouth 2 (two) times daily., Disp: 14 tablet, Rfl: 0  Observations/Objective: Patient is well-developed, well-nourished in no acute distress.  Resting comfortably at home.  Head is normocephalic, atraumatic.  No labored breathing.  Speech is clear  and coherent with logical content.  Patient is alert and oriented at baseline.  Patient sending picture of facial swelling, will be available in media once received  Assessment and Plan: 1. Tooth abscess - amoxicillin (AMOXIL) 500 MG capsule; Take 1 capsule (500 mg total) by mouth 3 (three) times daily for 10 days.  Dispense: 30 capsule; Refill: 0 - ibuprofen (ADVIL) 800 MG tablet; Take 1 tablet (800 mg total) by mouth every 8 (eight) hours as needed.  Dispense: 30 tablet; Refill: 0  - Suspect tooth abscess from broken tooth - Amoxicillin prescribed - Ibuprofen 800mg  for pain, alternate with tylenol - Ice to jaw - Keep scheduled f/u with dentist   Follow Up Instructions: I discussed the assessment and treatment plan with the patient. The patient was provided an opportunity to ask questions and all were answered. The patient agreed with the plan and demonstrated an understanding of the instructions.  A copy of instructions were sent to the patient via MyChart unless otherwise noted below.   The patient was advised to call back or seek an in-person evaluation if the symptoms worsen or if the condition fails to improve as anticipated.  Time:  I spent 12 minutes with the patient via telehealth technology discussing the above problems/concerns.    Mar Daring, PA-C

## 2022-07-12 ENCOUNTER — Encounter: Payer: Self-pay | Admitting: Physician Assistant

## 2022-09-24 ENCOUNTER — Other Ambulatory Visit (INDEPENDENT_AMBULATORY_CARE_PROVIDER_SITE_OTHER): Payer: Self-pay | Admitting: Physician Assistant

## 2022-09-24 ENCOUNTER — Other Ambulatory Visit: Payer: Self-pay | Admitting: Obstetrics

## 2022-09-24 DIAGNOSIS — B9689 Other specified bacterial agents as the cause of diseases classified elsewhere: Secondary | ICD-10-CM

## 2022-09-24 DIAGNOSIS — K047 Periapical abscess without sinus: Secondary | ICD-10-CM

## 2022-09-24 DIAGNOSIS — N760 Acute vaginitis: Secondary | ICD-10-CM

## 2022-09-29 ENCOUNTER — Other Ambulatory Visit (HOSPITAL_COMMUNITY): Payer: Self-pay

## 2022-09-29 MED ORDER — METRONIDAZOLE 500 MG PO TABS
500.0000 mg | ORAL_TABLET | Freq: Two times a day (BID) | ORAL | 0 refills | Status: AC
  Filled 2022-09-29: qty 14, 7d supply, fill #0

## 2023-11-15 ENCOUNTER — Telehealth: Payer: Medicaid Other

## 2023-11-15 DIAGNOSIS — R3989 Other symptoms and signs involving the genitourinary system: Secondary | ICD-10-CM | POA: Diagnosis not present

## 2023-11-16 MED ORDER — CEPHALEXIN 500 MG PO CAPS: 500.0000 mg | ORAL_CAPSULE | Freq: Two times a day (BID) | ORAL | 0 refills | Status: AC

## 2023-11-16 NOTE — Progress Notes (Signed)
I have spent 5 minutes in review of e-visit questionnaire, review and updating patient chart, medical decision making and response to patient.   Mia Milan Cody Jacklynn Dehaas, PA-C    

## 2023-11-16 NOTE — Progress Notes (Signed)

## 2023-11-29 ENCOUNTER — Ambulatory Visit: Payer: Medicaid Other | Admitting: Internal Medicine

## 2024-01-11 ENCOUNTER — Telehealth: Payer: Medicaid Other | Admitting: Physician Assistant

## 2024-01-11 DIAGNOSIS — H6692 Otitis media, unspecified, left ear: Secondary | ICD-10-CM

## 2024-01-11 MED ORDER — AMOXICILLIN 875 MG PO TABS: 875.0000 mg | ORAL_TABLET | Freq: Two times a day (BID) | ORAL | 0 refills | Status: AC

## 2024-01-11 NOTE — Progress Notes (Signed)
I have spent 5 minutes in review of e-visit questionnaire, review and updating patient chart, medical decision making and response to patient.   Piedad Climes, PA-C

## 2024-01-11 NOTE — Progress Notes (Signed)
E-Visit for Ear Pain - Acute Otitis Media   We are sorry that you are not feeling well. Here is how we plan to help!  Based on what you have shared with me it looks like you have Acute Otitis Media.  Acute Otitis Media is an infection of the middle or "inner" ear. This type of infection can cause redness, inflammation, and fluid buildup behind the tympanic membrane (ear drum).  The usual symptoms include: Earache/Pain Fever Upper respiratory symptoms Lack of energy/Fatigue/Malaise Slight hearing loss gradually worsening- if the inner ear fills with fluid What causes middle ear infections? Most middle ear infections occur when an infection such as a cold, leads to a build-up of mucus in the middle ear and causes the Eustachian tube (a thin tube that runs from the middle ear to the back of the nose) to become swollen or blocked.   This means mucus can't drain away properly, making it easier for an infection to spread into the middle ear.  How middle ear infections are treated: Most ear infections clear up within three to five days and don't need any specific treatment. If necessary, tylenol or ibuprofen should be used to relieve pain and a high temperature.  If you develop a fever higher than 102, or any significantly worsening symptoms, this could indicate a more serious infection moving to the middle/inner and needs face to face evaluation in an office by a provider.   Antibiotics aren't routinely used to treat middle ear infections, although they may occasionally be prescribed if symptoms persist or are particularly severe. Given your presentation,   I have prescribed Amoxicillin 875 mg one tablet twice daily for 10 days   Your symptoms should improve over the next 3 days and should resolve in about 7 days. Be sure to complete ALL of the prescription(s) given.  HOME CARE: Wash your hands frequently. If you are prescribed an ear drop, do not place the tip of the bottle on your ear or  touch it with your fingers. You can take Acetaminophen 650 mg every 4-6 hours as needed for pain.  If pain is severe or moderate, you can apply a heating pad (set on low) or hot water bottle (wrapped in a towel) to outer ear for 20 minutes.  This will also increase drainage.  GET HELP RIGHT AWAY IF: Fever is over 102.2 degrees. You develop progressive ear pain or hearing loss. Ear symptoms persist longer than 3 days after treatment.  MAKE SURE YOU: Understand these instructions. Will watch your condition. Will get help right away if you are not doing well or get worse.  Thank you for choosing an e-visit.  Your e-visit answers were reviewed by a board certified advanced clinical practitioner to complete your personal care plan. Depending upon the condition, your plan could have included both over the counter or prescription medications.  Please review your pharmacy choice. Make sure the pharmacy is open so you can pick up the prescription now. If there is a problem, you may contact your provider through [employer] and have the prescription routed to another pharmacy.  Your safety is important to [place]. If you have drug allergies check your prescription carefully.   For the next 24 hours you can use MyChart to ask questions about today's visit, request a non-urgent call back, or ask for a work or school excuse. You will get an email with a survey after your eVisit asking about your experience. We would appreciate your feedback. I hope  that your e-visit has been valuable and will aid in your recovery.

## 2024-06-30 ENCOUNTER — Other Ambulatory Visit: Payer: Self-pay | Admitting: Medical Genetics

## 2024-09-28 ENCOUNTER — Other Ambulatory Visit: Payer: Self-pay | Admitting: Medical Genetics

## 2024-09-28 DIAGNOSIS — Z006 Encounter for examination for normal comparison and control in clinical research program: Secondary | ICD-10-CM
